# Patient Record
Sex: Male | Born: 1960 | Race: Black or African American | Hispanic: No | Marital: Married | Smoking: Former smoker
Health system: Southern US, Community
[De-identification: ages and names within clinical notes are randomized; demographics above are authoritative.]

## PROBLEM LIST (undated history)

## (undated) DIAGNOSIS — T7840XA Allergy, unspecified, initial encounter: Secondary | ICD-10-CM

## (undated) DIAGNOSIS — M1990 Unspecified osteoarthritis, unspecified site: Secondary | ICD-10-CM

## (undated) DIAGNOSIS — S68019A Complete traumatic metacarpophalangeal amputation of unspecified thumb, initial encounter: Secondary | ICD-10-CM

## (undated) DIAGNOSIS — L209 Atopic dermatitis, unspecified: Secondary | ICD-10-CM

## (undated) DIAGNOSIS — H0260 Xanthelasma of unspecified eye, unspecified eyelid: Secondary | ICD-10-CM

## (undated) DIAGNOSIS — K5790 Diverticulosis of intestine, part unspecified, without perforation or abscess without bleeding: Secondary | ICD-10-CM

## (undated) DIAGNOSIS — M109 Gout, unspecified: Secondary | ICD-10-CM

## (undated) DIAGNOSIS — E785 Hyperlipidemia, unspecified: Secondary | ICD-10-CM

## (undated) DIAGNOSIS — I10 Essential (primary) hypertension: Secondary | ICD-10-CM

## (undated) HISTORY — DX: Xanthelasma of unspecified eye, unspecified eyelid: H02.60

## (undated) HISTORY — PX: MOUTH SURGERY: SHX715

## (undated) HISTORY — PX: FOREARM SURGERY: SHX651

## (undated) HISTORY — DX: Gout, unspecified: M10.9

## (undated) HISTORY — PX: SKIN GRAFT: SHX250

## (undated) HISTORY — DX: Hyperlipidemia, unspecified: E78.5

## (undated) HISTORY — DX: Unspecified osteoarthritis, unspecified site: M19.90

## (undated) HISTORY — DX: Atopic dermatitis, unspecified: L20.9

## (undated) HISTORY — DX: Complete traumatic metacarpophalangeal amputation of unspecified thumb, initial encounter: S68.019A

## (undated) HISTORY — PX: THUMB AMPUTATION: SHX804

## (undated) HISTORY — DX: Essential (primary) hypertension: I10

## (undated) HISTORY — DX: Allergy, unspecified, initial encounter: T78.40XA

## (undated) HISTORY — DX: Diverticulosis of intestine, part unspecified, without perforation or abscess without bleeding: K57.90

---

## 1998-10-09 ENCOUNTER — Ambulatory Visit (HOSPITAL_COMMUNITY): Admission: RE | Admit: 1998-10-09 | Discharge: 1998-10-09 | Payer: Self-pay | Admitting: Orthopedic Surgery

## 1998-10-09 ENCOUNTER — Encounter: Payer: Self-pay | Admitting: Orthopedic Surgery

## 1999-06-14 HISTORY — PX: KNEE SURGERY: SHX244

## 2000-07-12 ENCOUNTER — Encounter: Payer: Self-pay | Admitting: Family Medicine

## 2000-07-12 ENCOUNTER — Encounter: Admission: RE | Admit: 2000-07-12 | Discharge: 2000-07-12 | Payer: Self-pay | Admitting: Family Medicine

## 2002-03-15 ENCOUNTER — Encounter: Admission: RE | Admit: 2002-03-15 | Discharge: 2002-03-15 | Payer: Self-pay | Admitting: Family Medicine

## 2002-03-15 ENCOUNTER — Encounter: Payer: Self-pay | Admitting: Family Medicine

## 2011-12-13 ENCOUNTER — Telehealth: Payer: Self-pay | Admitting: Family Medicine

## 2011-12-13 ENCOUNTER — Encounter: Payer: Self-pay | Admitting: Internal Medicine

## 2011-12-13 ENCOUNTER — Ambulatory Visit (INDEPENDENT_AMBULATORY_CARE_PROVIDER_SITE_OTHER): Payer: BC Managed Care – PPO | Admitting: Medical

## 2011-12-13 ENCOUNTER — Encounter: Payer: Self-pay | Admitting: Medical

## 2011-12-13 VITALS — BP 142/90 | HR 60 | Temp 97.9°F | Resp 16 | Ht 69.0 in | Wt 231.0 lb

## 2011-12-13 DIAGNOSIS — Z1211 Encounter for screening for malignant neoplasm of colon: Secondary | ICD-10-CM

## 2011-12-13 DIAGNOSIS — D492 Neoplasm of unspecified behavior of bone, soft tissue, and skin: Secondary | ICD-10-CM

## 2011-12-13 DIAGNOSIS — Z125 Encounter for screening for malignant neoplasm of prostate: Secondary | ICD-10-CM

## 2011-12-13 DIAGNOSIS — I10 Essential (primary) hypertension: Secondary | ICD-10-CM

## 2011-12-13 DIAGNOSIS — M2550 Pain in unspecified joint: Secondary | ICD-10-CM | POA: Insufficient documentation

## 2011-12-13 DIAGNOSIS — Z0000 Encounter for general adult medical examination without abnormal findings: Secondary | ICD-10-CM

## 2011-12-13 DIAGNOSIS — E785 Hyperlipidemia, unspecified: Secondary | ICD-10-CM

## 2011-12-13 DIAGNOSIS — Z23 Encounter for immunization: Secondary | ICD-10-CM

## 2011-12-13 DIAGNOSIS — E669 Obesity, unspecified: Secondary | ICD-10-CM | POA: Insufficient documentation

## 2011-12-13 LAB — TSH: TSH: 1.103 u[IU]/mL (ref 0.350–4.500)

## 2011-12-13 LAB — POCT URINALYSIS DIPSTICK
Bilirubin, UA: NEGATIVE
Blood, UA: NEGATIVE
Glucose, UA: NEGATIVE
Ketones, UA: NEGATIVE
Leukocytes, UA: NEGATIVE
Nitrite, UA: NEGATIVE
Protein, UA: NEGATIVE
Spec Grav, UA: <=1.005
Urobilinogen, UA: NEGATIVE
pH, UA: 5

## 2011-12-13 LAB — COMPREHENSIVE METABOLIC PANEL
ALT: 30 U/L (ref 0–53)
AST: 19 U/L (ref 0–37)
Albumin: 4.8 g/dL (ref 3.5–5.2)
Alkaline Phosphatase: 42 U/L (ref 39–117)
BUN: 21 mg/dL (ref 6–23)
CO2: 25 mEq/L (ref 19–32)
Calcium: 10 mg/dL (ref 8.4–10.5)
Chloride: 106 mEq/L (ref 96–112)
Creat: 1.21 mg/dL (ref 0.50–1.35)
Glucose, Bld: 98 mg/dL (ref 70–99)
Potassium: 4.4 mEq/L (ref 3.5–5.3)
Sodium: 141 mEq/L (ref 135–145)
Total Bilirubin: 0.5 mg/dL (ref 0.3–1.2)
Total Protein: 7.1 g/dL (ref 6.0–8.3)

## 2011-12-13 LAB — LIPID PANEL
Cholesterol: 276 mg/dL — ABNORMAL HIGH (ref 0–200)
HDL: 36 mg/dL — ABNORMAL LOW (ref >39)
LDL Cholesterol: 205 mg/dL — ABNORMAL HIGH (ref 0–99)
Total CHOL/HDL Ratio: 7.7 Ratio
Triglycerides: 176 mg/dL — ABNORMAL HIGH (ref <150)
VLDL: 35 mg/dL (ref 0–40)

## 2011-12-13 MED ORDER — LISINOPRIL-HYDROCHLOROTHIAZIDE 20-25 MG PO TABS: 1.0000 | ORAL_TABLET | Freq: Every day | ORAL | Status: DC

## 2011-12-13 NOTE — Progress Notes (Signed)
Subjective:   HPI  Troy Stevens is a 51 y.o. male who presents for a complete physical.  New patient today, accompanied by wife.  Has hx/o hypertension, compliant with medication but says BP always elevated at dentist and other places.  He notes hx/o high cholesterol but not prior on medication.  He has never had colonoscopy.  He hasn't been to dentist in a while.  Was seeing [facility] prior, but always had to wait long there so here to establish.  Gets occasional flare up of skin rash on elbow.  He is mainly concerned about arthritis.  Been told in the past he may have RA.  Gets pains and swelling in wrists, both knees and both elbows.  Gets pain in both ankles too.  Sometimes gets hand pains, but mostly wrists pain and swelling in terms of upper extremities.  Was exercising but not much at current due to knee pain.  takes colchicine TID, but has been on Allopurinol prior.   He is not sure whether he has gout or not.  Has lesion of left ear growing last few months.  Reviewed their medical, surgical, family, social, medication, and allergy history and updated chart as appropriate.  Past Medical History  Diagnosis Date  . Hyperlipidemia   . Hypertension   . Allergy   . Arthritis   . Atopic dermatitis   . Amputation of thumb     left, s/p conveyer belt injury  . Xanthelasma of eyelid     Past Surgical History  Procedure Date  . Knee surgery 2001    left, arthroscopic  . Forearm surgery     left trauma repair and skin graft s/p conveyer belt injury  . Skin graft     removed from left thigh, transplanted to left forearm  . Mouth surgery     Family History  Problem Relation Age of Onset  . Other Mother     died in MVA  . Other Father     died in MVA  . Arthritis Brother   . Hypertension Sister   . Hyperlipidemia Paternal Aunt   . Stroke Paternal Grandfather   . Heart disease Neg Hx   . Cancer Neg Hx     History   Social History  . Marital Status: Single   Spouse Name: N/A    Number of Children: N/A  . Years of Education: N/A   Occupational History  . laid off     use to work at [employer]   Social History Main Topics  . Smoking status: Former Smoker -- 0.2 packs/day for 20 years    Types: Cigarettes  . Smokeless tobacco: Not on file  . Alcohol Use: Not on file  . Drug Use: Yes    Special: Marijuana  . Sexually Active: Not on file   Other Topics Concern  . Not on file   Social History Narrative   Married, 2 children, exercise - walking some    Current Outpatient Prescriptions on File Prior to Visit  Medication Sig Dispense Refill  . colchicine 0.6 MG tablet Take 0.6 mg by mouth daily.      Marland Kitchen lisinopril-hydrochlorothiazide (PRINZIDE,ZESTORETIC) 20-25 MG per tablet Take 1 tablet by mouth daily.  30 tablet  5    No Known Allergies    Review of Systems Constitutional: -fever, -chills, -sweats, -unexpected weight change, -anorexia, -fatigue Allergy: -sneezing, -itching, -congestion Dermatology: denies changing moles, rash, lumps, new worrisome lesions ENT: -runny nose, -ear pain, -sore  throat, -hoarseness, -sinus pain, -teeth pain, -tinnitus, -hearing loss, -epistaxis Cardiology:  -chest pain, -palpitations, +edema, -orthopnea, -paroxysmal nocturnal dyspnea Respiratory: -cough, -shortness of breath, -dyspnea on exertion, -wheezing, -hemoptysis Gastroenterology: -abdominal pain, -nausea, -vomiting, -diarrhea, -constipation, -blood in stool, -changes in bowel movement, -dysphagia Hematology: -bleeding or bruising problems Musculoskeletal: +arthralgias, +myalgias,+-joint swelling, +back pain, -neck pain, +cramping, -gait changes Ophthalmology: -vision changes, -eye redness, -itching, -discharge Urology: -dysuria, -difficulty urinating, -hematuria, -urinary frequency, -urgency, incontinence Neurology: -headache, -weakness, -tingling, -numbness, -speech abnormality, -memory loss, -falls, -dizziness Psychology:  -depressed mood,  -agitation, +sleep problems  Objective:   Physical Exam  Filed Vitals:   12/13/11 0846  BP: 142/90  Pulse: 60  Temp: 97.9 F (36.6 C)  Resp: 16    General appearance: alert, no distress, WD/WN, AA male, obese Skin:left upper earlobe with 3mm round hard yellowish lesion, few other benign appearing macules, left lateral forearm with obvious deformity of skin from prior trauma, has appearance of prior burn or skin grafting, left upper thigh with rectangular skin scar from prior skin graft  HEENT: normocephalic, conjunctiva/corneas normal, sclerae anicteric, PERRLA, EOMi, nares patent, no discharge or erythema, pharynx normal Oral cavity: MMM, tongue normal, teeth with left lower molar with decay, otherwise teeth in good repair Neck: supple, no lymphadenopathy, no thyromegaly, no masses, normal ROM, no bruits Chest: non tender, normal shape and expansion Heart: RRR, normal S1, S2, no murmurs Lungs: CTA bilaterally, no wheezes, rhonchi, or rales Abdomen: +bs, soft, non tender, non distended, no masses, no hepatomegaly, no splenomegaly, no bruits Back: non tender, normal ROM, no scoliosis Musculoskeletal: left hand s/p thumb amputation, increased abduction of left index finger, otherwise no obvious swelling, upper extremities non tender, no obvious deformity, normal ROM throughout, lower extremities non tender, no obvious deformity, normal ROM throughout Extremities: no edema, no cyanosis, no clubbing Pulses: 2+ symmetric, upper and lower extremities, normal cap refill Neurological: alert, oriented x 3, CN2-12 intact, strength normal upper extremities and lower extremities, sensation normal throughout, DTRs 2+ throughout, no cerebellar signs, gait normal Psychiatric: normal affect, behavior normal, pleasant  GU: normal male external genitalia, nontender, no masses, no hernia, no lymphadenopathy Rectal: anus normal, prostate WNL, no nodules, occult negative stool   Assessment and Plan :      Encounter Diagnoses  Name Primary?  . Routine general medical examination at a health care facility Yes  . Essential hypertension, benign   . Hyperlipidemia   . Obesity   . Polyarthralgia   . Screen for colon cancer   . Screening for prostate cancer   . Need for Tdap vaccination   . Neoplasm of skin of ear     Physical exam - discussed healthy lifestyle, diet, exercise, preventative care, vaccinations, and addressed their concerns.    HTN - increased to Lisinopril/HCT 20/25 mg today.  Recheck 67mo.  Hyperlipidemia and xanthelasma  - will likely need to address medication.  Labs today.  Obesity - discussed need for lifestyle changes, weight loss  Polyarthralgia - given prior medication and hx/o, may have gout and OA, but labs today to screen for rheumatoid arthritis  Referrals for colonoscopy screening and dermatology today for left ear skin lesion.     tdap given, VIS and counseling given  Follow-up pending labs and referrals, recheck 67mo on HTN.

## 2011-12-13 NOTE — Telephone Encounter (Signed)
Patient was made aware of his 2 appointments at the time of his OV 12/13/11 @ 1000 am. [facility] Aug. [phone] @ 920 am [facility] Aug 6,0454 @ 800 am Nurse Visit Aug 20,2013 @ 1100 am [facility]

## 2011-12-13 NOTE — Patient Instructions (Addendum)
Recommendations:  We are referring you to gastroenterology for first screening colonoscopy  We are referring you to dermatology for skin lesion of left earlobe  i increased your blood pressure medication to Lisinopril/HCT 20/25 today.  Lets recheck this in 40month  We updated your tetanus booster today  We are checking labs today for possible gout vs rheumatoid arthritis  We will call with lab results    Preventative Care for Adults, Male       REGULAR HEALTH EXAMS:  A routine yearly physical is a good way to check in with your primary care provider about your health and preventive screening. It is also an opportunity to share updates about your health and any concerns you have, and receive a thorough all-over exam.   Most health insurance companies pay for at least some preventative services.  Check with your health plan for specific coverages.  WHAT PREVENTATIVE SERVICES DO MEN NEED?  Adult men should have their weight and blood pressure checked regularly.   Men age 7 and older should have their cholesterol levels checked regularly.  Beginning at age 21 and continuing to age 66, men should be screened for colorectal cancer.  Certain people should may need continued testing until age 69.  Other cancer screening may include exams for testicular and prostate cancer.  Updating vaccinations is part of preventative care.  Vaccinations help protect against diseases such as the flu.  Lab tests are generally done as part of preventative care to screen for anemia and blood disorders, to screen for problems with the kidneys and liver, to screen for bladder problems, to check blood sugar, and to check your cholesterol level.  Preventative services generally include counseling about diet, exercise, avoiding tobacco, drugs, excessive alcohol consumption, and sexually transmitted infections.    GENERAL RECOMMENDATIONS FOR GOOD HEALTH:  Healthy diet:  Eat a variety of foods, including  fruit, vegetables, animal or vegetable protein, such as meat, fish, chicken, and eggs, or beans, lentils, tofu, and grains, such as rice.  Drink plenty of water daily.  Decrease saturated fat in the diet, avoid lots of red meat, processed foods, sweets, fast foods, and fried foods.  Exercise:  Aerobic exercise helps maintain good heart health. At least 30-40 minutes of moderate-intensity exercise is recommended. For example, a brisk walk that increases your heart rate and breathing. This should be done on most days of the week.   Find a type of exercise or a variety of exercises that you enjoy so that it becomes a part of your daily life.  Examples are running, walking, swimming, water aerobics, and biking.  For motivation and support, explore group exercise such as aerobic class, spin class, Zumba, Yoga,or  martial arts, etc.    Set exercise goals for yourself, such as a certain weight goal, walk or run in a race such as a 5k walk/run.  Speak to your primary care provider about exercise goals.  Disease prevention:  If you smoke or chew tobacco, find out from your caregiver how to quit. It can literally save your life, no matter how long you have been a tobacco user. If you do not use tobacco, never begin.   Maintain a healthy diet and normal weight. Increased weight leads to problems with blood pressure and diabetes.   The Body Mass Index or BMI is a way of measuring how much of your body is fat. Having a BMI above 27 increases the risk of heart disease, diabetes, hypertension, stroke and other problems  related to obesity. Your caregiver can help determine your BMI and based on it develop an exercise and dietary program to help you achieve or maintain this important measurement at a healthful level.  High blood pressure causes heart and blood vessel problems.  Persistent high blood pressure should be treated with medicine if weight loss and exercise do not work.   Fat and cholesterol leaves  deposits in your arteries that can block them. This causes heart disease and vessel disease elsewhere in your body.  If your cholesterol is found to be high, or if you have heart disease or certain other medical conditions, then you may need to have your cholesterol monitored frequently and be treated with medication.   Ask if you should have a stress test if your history suggests this. A stress test is a test done on a treadmill that looks for heart disease. This test can find disease prior to there being a problem.  Avoid drinking alcohol in excess (more than two drinks per day).  Avoid use of street drugs. Do not share needles with anyone. Ask for professional help if you need assistance or instructions on stopping the use of alcohol, cigarettes, and/or drugs.  Brush your teeth twice a day with fluoride toothpaste, and floss once a day. Good oral hygiene prevents tooth decay and gum disease. The problems can be painful, unattractive, and can cause other health problems. Visit your dentist for a routine oral and dental check up and preventive care every 6-12 months.   Look at your skin regularly.  Use a mirror to look at your back. Notify your caregivers of changes in moles, especially if there are changes in shapes, colors, a size larger than a pencil eraser, an irregular border, or development of new moles.  Safety:  Use seatbelts 100% of the time, whether driving or as a passenger.  Use safety devices such as hearing protection if you work in environments with loud noise or significant background noise.  Use safety glasses when doing any work that could send debris in to the eyes.  Use a helmet if you ride a bike or motorcycle.  Use appropriate safety gear for contact sports.  Talk to your caregiver about gun safety.  Use sunscreen with a SPF (or skin protection factor) of 15 or greater.  Lighter skinned people are at a greater risk of skin cancer. Don't forget to also wear sunglasses in order to  protect your eyes from too much damaging sunlight. Damaging sunlight can accelerate cataract formation.   Practice safe sex. Use condoms. Condoms are used for birth control and to help reduce the spread of sexually transmitted infections (or STIs).  Some of the STIs are gonorrhea (the clap), chlamydia, syphilis, trichomonas, herpes, HPV (human papilloma virus) and HIV (human immunodeficiency virus) which causes AIDS. The herpes, HIV and HPV are viral illnesses that have no cure. These can result in disability, cancer and death.   Keep carbon monoxide and smoke detectors in your home functioning at all times. Change the batteries every 6 months or use a model that plugs into the wall.   Vaccinations:  Stay up to date with your tetanus shots and other required immunizations. You should have a booster for tetanus every 10 years. Be sure to get your flu shot every year, since 5%-20% of the [place] population comes down with the flu. The flu vaccine changes each year, so being vaccinated once is not enough. Get your shot in the fall, before the  flu season peaks.   Other vaccines to consider:  Pneumococcal vaccine to protect against certain types of pneumonia.  This is normally recommended for adults age 66 or older.  However, adults younger than 51 years old with certain underlying conditions such as diabetes, heart or lung disease should also receive the vaccine.  Shingles vaccine to protect against Varicella Zoster if you are older than age 92, or younger than 51 years old with certain underlying illness.  Hepatitis A vaccine to protect against a form of infection of the liver by a virus acquired from food.  Hepatitis B vaccine to protect against a form of infection of the liver by a virus acquired from blood or body fluids, particularly if you work in health care.  If you plan to travel internationally, check with your local health department for specific vaccination recommendations.  Cancer  Screening:  Most routine colon cancer screening begins at the age of 41. On a yearly basis, doctors may provide special easy to use take-home tests to check for hidden blood in the stool. Sigmoidoscopy or colonoscopy can detect the earliest forms of colon cancer and is life saving. These tests use a small camera at the end of a tube to directly examine the colon. Speak to your caregiver about this at age 12, when routine screening begins (and is repeated every 5 years unless early forms of pre-cancerous polyps or small growths are found).   At the age of 80 men usually start screening for prostate cancer every year. Screening may begin at a younger age for those with higher risk. Those at higher risk include African-Americans or having a family history of prostate cancer. There are two types of tests for prostate cancer:   Prostate-specific antigen (PSA) testing. Recent studies raise questions about prostate cancer using PSA and you should discuss this with your caregiver.   Digital rectal exam (in which your doctor's lubricated and gloved finger feels for enlargement of the prostate through the anus).   Screening for testicular cancer.  Do a monthly exam of your testicles. Gently roll each testicle between your thumb and fingers, feeling for any abnormal lumps. The best time to do this is after a hot shower or bath when the tissues are looser. Notify your caregivers of any lumps, tenderness or changes in size or shape immediately.

## 2011-12-14 LAB — CBC
HCT: 43.5 % (ref 39.0–52.0)
Hemoglobin: 14.4 g/dL (ref 13.0–17.0)
MCH: 31 pg (ref 26.0–34.0)
MCHC: 33.1 g/dL (ref 30.0–36.0)
MCV: 93.5 fL (ref 78.0–100.0)
Platelets: 300 10*3/uL (ref 150–400)
RBC: 4.65 MIL/uL (ref 4.22–5.81)
RDW: 14.1 % (ref 11.5–15.5)
WBC: 6.3 10*3/uL (ref 4.0–10.5)

## 2011-12-14 LAB — SEDIMENTATION RATE: Sed Rate: 4 mm/hr (ref 0–16)

## 2012-01-04 ENCOUNTER — Ambulatory Visit (INDEPENDENT_AMBULATORY_CARE_PROVIDER_SITE_OTHER): Payer: BC Managed Care – PPO | Admitting: Medical

## 2012-01-04 ENCOUNTER — Encounter: Payer: Self-pay | Admitting: Medical

## 2012-01-04 VITALS — BP 130/80 | HR 64 | Temp 98.1°F | Resp 16 | Wt 224.0 lb

## 2012-01-04 DIAGNOSIS — T50905A Adverse effect of unspecified drugs, medicaments and biological substances, initial encounter: Secondary | ICD-10-CM

## 2012-01-04 DIAGNOSIS — M1A9XX1 Chronic gout, unspecified, with tophus (tophi): Secondary | ICD-10-CM | POA: Insufficient documentation

## 2012-01-04 DIAGNOSIS — N529 Male erectile dysfunction, unspecified: Secondary | ICD-10-CM

## 2012-01-04 DIAGNOSIS — I10 Essential (primary) hypertension: Secondary | ICD-10-CM

## 2012-01-04 DIAGNOSIS — E785 Hyperlipidemia, unspecified: Secondary | ICD-10-CM

## 2012-01-04 DIAGNOSIS — M109 Gout, unspecified: Secondary | ICD-10-CM

## 2012-01-04 MED ORDER — LISINOPRIL 20 MG PO TABS: 20.0000 mg | ORAL_TABLET | Freq: Every day | ORAL | Status: DC

## 2012-01-04 MED ORDER — SILDENAFIL CITRATE 50 MG PO TABS: 50.0000 mg | ORAL_TABLET | Freq: Every day | ORAL | Status: DC | PRN

## 2012-01-04 MED ORDER — ASPIRIN 81 MG PO TBEC: 81.0000 mg | DELAYED_RELEASE_TABLET | Freq: Every day | ORAL | Status: DC

## 2012-01-04 MED ORDER — ALLOPURINOL 100 MG PO TABS: 100.0000 mg | ORAL_TABLET | Freq: Every day | ORAL | Status: DC

## 2012-01-04 MED ORDER — ATORVASTATIN CALCIUM 20 MG PO TABS: 20.0000 mg | ORAL_TABLET | Freq: Every day | ORAL | Status: DC

## 2012-01-04 NOTE — Progress Notes (Signed)
Subjective:   HPI  Troy Stevens is a 51 y.o. male who presents with 61mo f/u.  I saw him last month for a new patient physical.  Here for recheck on blood pressure.   i increased his medication from Lisinopril 20mg  to Lisinopril/HCT 20/25mg  last visit, but the first 3 days starting this he got bad cramps.  He was also out in the host sun those days.  He quit this due to the bad cramps.  Things went back to normal after starting the medication.  He is here for f/u on abnormal labs regarding uric acid/gout, cholesterol, and in general.  He note ED for years.  Has had problems getting and keeping erections.  Currently has no morning erections and rarely gets a hard enough erection for penetration.  Has used Viagra in the remote past .  Would like something to help erections.  No other aggravating or relieving factors.    No other c/o.  The following portions of the patient's history were reviewed and updated as appropriate: allergies, current medications, past family history, past medical history, past social history, past surgical history and problem list.  Past Medical History  Diagnosis Date  . Hyperlipidemia   . Hypertension   . Allergy   . Arthritis   . Atopic dermatitis   . Amputation of thumb     left, s/p conveyer belt injury  . Xanthelasma of eyelid   . Gout     No Known Allergies   Review of Systems ROS reviewed and was negative other than noted in HPI or above.    Objective:   Physical Exam  General appearance: alert, no distress, WD/WN Heart: RRR, normal S1, S2, no murmurs Lungs: CTA bilaterally, no wheezes, rhonchi, or rales  Assessment and Plan :     Encounter Diagnoses  Name Primary?  . Essential hypertension, benign Yes  . Hyperlipidemia   . Gout   . ED (erectile dysfunction)   . Adverse drug effect    HTN - improved today.  Just c/t back on Lisinopril 20mg  daily and recheck 4mo.  Work on diet, exercise, and weight loss as well.    Hyperlipidemia -  discussed risks of elevated lipids, begin Lipitor 20mg  daily and Aspirin 81mg  daily.    Gout - uric acid 11.2.  Begin Allopurinol 100mg  daily.   He has been on 300mg  daily in the past.  Use colchicine BID for the next 1-2 wk while starting Allopurinol.   ED - discussed causes, begin Viagra trial, 50mg , 1/2 tablet per day intially.  discussed risks of medication, when to call/return if problems. He has tried Viagra in the past (100mg ) with good response.   Adverse drug effect - didn't tolerate 25mg  HCTZ due to bad cramps.   RTC 4mo, sooner prn.

## 2012-01-04 NOTE — Patient Instructions (Signed)
Blood pressure - lets go back to Lisinopril 20mg  daily.  We will hold off on the fluid pill for now.  Gout - begin Allopurinol 100mg  daily to lower your uric acid.  During the first 2 weeks while starting on the Allopurinol, I want you to use the Colchicine/Colcyrs twice daily to help reduce the chance of a flare up.  You cholesterol is too high.  I want you to begin Lipitor every night to lower cholesterol.  Also, begin a baby aspirin 81mg  every night with the Lipitor to lower your heart disease risks.   For knee arthritis - lets see how the allopurinol does, but after a few weeks, if you still have joint pains, consider OTC Tylenol Arthritis or Aleve as needed up to twice daily.

## 2012-01-17 ENCOUNTER — Encounter: Payer: Self-pay | Admitting: Internal Medicine

## 2012-01-17 ENCOUNTER — Ambulatory Visit (AMBULATORY_SURGERY_CENTER): Payer: BC Managed Care – PPO | Admitting: *Deleted

## 2012-01-17 VITALS — Ht 68.0 in | Wt 225.4 lb

## 2012-01-17 DIAGNOSIS — Z1211 Encounter for screening for malignant neoplasm of colon: Secondary | ICD-10-CM

## 2012-01-17 MED ORDER — MOVIPREP 100 G PO SOLR: ORAL | Status: DC

## 2012-01-31 ENCOUNTER — Ambulatory Visit (AMBULATORY_SURGERY_CENTER): Payer: BC Managed Care – PPO | Admitting: Internal Medicine

## 2012-01-31 ENCOUNTER — Encounter: Payer: Self-pay | Admitting: Internal Medicine

## 2012-01-31 VITALS — BP 129/79 | HR 70 | Temp 97.7°F | Resp 17 | Ht 68.0 in | Wt 225.0 lb

## 2012-01-31 DIAGNOSIS — Z1211 Encounter for screening for malignant neoplasm of colon: Secondary | ICD-10-CM

## 2012-01-31 HISTORY — PX: COLONOSCOPY: SHX174

## 2012-01-31 MED ORDER — SODIUM CHLORIDE 0.9 % IV SOLN: 500.0000 mL | INTRAVENOUS | Status: DC

## 2012-01-31 NOTE — Patient Instructions (Addendum)
The colon was normal! No polyps or cancer seen.  Next routine colonoscopy in about 10 years (2023).  Thank you for choosing me and [facility]. Diverticulosis, mild. Iva Boop, MD, FACG YOU HAD AN ENDOSCOPIC PROCEDURE TODAY AT THE [facility]: Refer to the procedure report that was given to you for any specific questions about what was found during the examination.  If the procedure report does not answer your questions, please call your gastroenterologist to clarify.  If you requested that your care partner not be given the details of your procedure findings, then the procedure report has been included in a sealed envelope for you to review at your convenience later.  YOU SHOULD EXPECT: Some feelings of bloating in the abdomen. Passage of more gas than usual.  Walking can help get rid of the air that was put into your GI tract during the procedure and reduce the bloating. If you had a lower endoscopy (such as a colonoscopy or flexible sigmoidoscopy) you may notice spotting of blood in your stool or on the toilet paper. If you underwent a bowel prep for your procedure, then you may not have a normal bowel movement for a few days.  DIET: Your first meal following the procedure should be a light meal and then it is ok to progress to your normal diet.  A half-sandwich or bowl of soup is an example of a good first meal.  Heavy or fried foods are harder to digest and may make you feel nauseous or bloated.  Likewise meals heavy in dairy and vegetables can cause extra gas to form and this can also increase the bloating.  Drink plenty of fluids but you should avoid alcoholic beverages for 24 hours.  ACTIVITY: Your care partner should take you home directly after the procedure.  You should plan to take it easy, moving slowly for the rest of the day.  You can resume normal activity the day after the procedure however you should NOT DRIVE or use heavy machinery for 24 hours (because  of the sedation medicines used during the test).    SYMPTOMS TO REPORT IMMEDIATELY: A gastroenterologist can be reached at any hour.  During normal business hours, 8:30 AM to 5:00 PM Monday through Friday, call [phone].  After hours and on weekends, please call the GI answering service at [phone] who will take a message and have the physician on call contact you.   Following lower endoscopy (colonoscopy or flexible sigmoidoscopy):  Excessive amounts of blood in the stool  Significant tenderness or worsening of abdominal pains  Swelling of the abdomen that is new, acute  Fever of 100F or higher  Following upper endoscopy (EGD)  Vomiting of blood or coffee ground material  New chest pain or pain under the shoulder blades  Painful or persistently difficult swallowing  New shortness of breath  Fever of 100F or higher  Black, tarry-looking stools  FOLLOW UP: If any biopsies were taken you will be contacted by phone or by letter within the next 1-3 weeks.  Call your gastroenterologist if you have not heard about the biopsies in 3 weeks.  Our staff will call the home number listed on your records the next business day following your procedure to check on you and address any questions or concerns that you may have at that time regarding the information given to you following your procedure. This is a courtesy call and so if there is no answer at the home  number and we have not heard from you through the emergency physician on call, we will assume that you have returned to your regular daily activities without incident.  SIGNATURES/CONFIDENTIALITY: You and/or your care partner have signed paperwork which will be entered into your electronic medical record.  These signatures attest to the fact that that the information above on your After Visit Summary has been reviewed and is understood.  Full responsibility of the confidentiality of this discharge information lies with you and/or  your care-partner.

## 2012-01-31 NOTE — Progress Notes (Signed)
Patient did not experience any of the following events: a burn prior to discharge; a fall within the facility; wrong site/side/patient/procedure/implant event; or a hospital transfer or hospital admission upon discharge from the facility. (G8907) Patient did not have preoperative order for IV antibiotic SSI prophylaxis. (G8918)  

## 2012-01-31 NOTE — Op Note (Addendum)
[facility] [address] [place], [address]   COLONOSCOPY PROCEDURE REPORT  PATIENT: Troy Stevens, Troy Stevens  MR#: [id number] BIRTHDATE: [birth year] , 51  yrs. old GENDER: Male ENDOSCOPIST: Iva Boop, MD, [facility] REFERRED QM:VHQIONGE, Shane PA-C PROCEDURE DATE:  01/31/2012 PROCEDURE:   Colonoscopy, screening ASA CLASS:   Class III INDICATIONS:average risk screening. MEDICATIONS: propofol (Diprivan) 350mg  IV, MAC sedation, administered by CRNA, and These medications were titrated to patient response per physician's verbal order  DESCRIPTION OF PROCEDURE:   After the risks benefits and alternatives of the procedure were thoroughly explained, informed consent was obtained.  A digital rectal exam revealed the prostate was not enlarged.   The LB [id number]  endoscope was introduced through the anus and advanced to the cecum, which was identified by both the appendix and ileocecal valve. No adverse events experienced.   The quality of the prep was excellent, using MoviPrep  The instrument was then slowly withdrawn as the colon was fully examined.    COLON FINDINGS: A normal appearing cecum, ileocecal valve, and appendiceal orifice were identified.  The ascending, hepatic flexure, transverse, splenic flexure, descending, and rectum appeared unremarkable.  No polyps or cancers were seen.   A right colon retroflexion was performed.   Moderate diverticulosis was noted in the sigmoid colon.    Retroflexed views revealed no abnormalities.  The time to cecum = 1:19 minutes  Withdrawal time= 8:57 minutes.  The scope was withdrawn and the procedure completed. COMPLICATIONS: There were no complications. ENDOSCOPIC IMPRESSION: 1.   Sigmoid diverticulosis 2. Otherwise normal colon with excellent prep   RECOMMENDATIONS: Repeat Colonscopy in 10 years.  eSigned:  Iva Boop, MD, [facility] 01/31/2012 11:54 AM Revised: 01/31/2012 11:54 AM  CC: The Patient

## 2012-02-01 ENCOUNTER — Telehealth: Payer: Self-pay | Admitting: *Deleted

## 2012-02-01 NOTE — Telephone Encounter (Signed)
  Follow up Call-  Call back number 01/31/2012  Post procedure Call Back phone  # [phone]  Permission to leave phone message Yes     Patient questions:  Do you have a fever, pain , or abdominal swelling? no Pain Score  0 *  Have you tolerated food without any problems? yes  Have you been able to return to your normal activities? yes  Do you have any questions about your discharge instructions: Diet   no Medications  no Follow up visit  no  Do you have questions or concerns about your Care? no  Actions: * If pain score is 4 or above: No action needed, pain <4.

## 2012-04-03 ENCOUNTER — Ambulatory Visit (INDEPENDENT_AMBULATORY_CARE_PROVIDER_SITE_OTHER): Payer: BC Managed Care – PPO | Admitting: Medical

## 2012-04-03 ENCOUNTER — Encounter: Payer: Self-pay | Admitting: Medical

## 2012-04-03 VITALS — BP 120/80 | HR 80 | Temp 97.9°F | Resp 16 | Wt 224.0 lb

## 2012-04-03 DIAGNOSIS — N529 Male erectile dysfunction, unspecified: Secondary | ICD-10-CM

## 2012-04-03 DIAGNOSIS — M109 Gout, unspecified: Secondary | ICD-10-CM

## 2012-04-03 DIAGNOSIS — I10 Essential (primary) hypertension: Secondary | ICD-10-CM

## 2012-04-03 DIAGNOSIS — E785 Hyperlipidemia, unspecified: Secondary | ICD-10-CM

## 2012-04-03 LAB — COMPREHENSIVE METABOLIC PANEL
ALT: 38 U/L (ref 0–53)
AST: 26 U/L (ref 0–37)
Albumin: 4.8 g/dL (ref 3.5–5.2)
BUN: 18 mg/dL (ref 6–23)
CO2: 28 mEq/L (ref 19–32)
Calcium: 9.7 mg/dL (ref 8.4–10.5)
Chloride: 103 mEq/L (ref 96–112)
Creat: 1.27 mg/dL (ref 0.50–1.35)
Potassium: 4.2 mEq/L (ref 3.5–5.3)

## 2012-04-03 LAB — LIPID PANEL: Cholesterol: 137 mg/dL (ref 0–200)

## 2012-04-03 MED ORDER — SILDENAFIL CITRATE 100 MG PO TABS: 50.0000 mg | ORAL_TABLET | Freq: Every day | ORAL | Status: DC | PRN

## 2012-04-03 NOTE — Progress Notes (Signed)
Subjective: Here for 26mo f/u.  Last visit we made several changes.   He began back on Lisionpril 20mg  daily, he began Lipitor and aspirin for hyperlipidemia.  Compliant with medications without c/o.    ED - used trial of viagra at 25mg  and 50mg , and the higher dose worked ok, but thinks it needs to be a little stronger. No problems with the medication.  Gout - using Allopurinol 100mg  daily, but also using Colchicine BID although he was advised to use this for prn flare.  No gout flare ups since last visit.  Objective:   Physical Exam  Filed Vitals:   04/03/12 0807  BP: 120/80  Pulse: 80  Temp: 97.9 F (36.6 C)  Resp: 16    General appearance: alert, no distress, WD/WN Heart: RRR, normal S1, S2, no murmurs Lungs: CTA bilaterally, no wheezes, rhonchi, or rales Pulses: 2+ symmetric, upper and lower extremities, normal cap refill   Assessment and Plan :    Encounter Diagnoses  Name Primary?  . Essential hypertension, benign Yes  . Hyperlipidemia   . Gout   . ED (erectile dysfunction)    HTN - controlled on Lisinopril 20mg  daily.  C/t efforts to lose weight.  Labs today.  Hyperlipidemia - c/t Lipitor 20mg  daily, ASA 81mg  daily, labs today.  Lipitor started last visit.  Gout - stop Colchicine and just use for prn flare up.  C/t Allopurinol 100mg  daily.  ED - increase to trial of Viagra 100mg .  Samples given.   He declines flu vaccine.  F/u pending labs.

## 2012-04-04 ENCOUNTER — Telehealth: Payer: Self-pay | Admitting: Internal Medicine

## 2012-04-04 MED ORDER — ATORVASTATIN CALCIUM 20 MG PO TABS: 20.0000 mg | ORAL_TABLET | Freq: Every day | ORAL | Status: DC

## 2012-04-04 NOTE — Telephone Encounter (Signed)
I SENT THE PATIENTS RX REFILLS INTO HIS PHARMACY. CLS

## 2012-04-27 ENCOUNTER — Telehealth: Payer: Self-pay | Admitting: Internal Medicine

## 2012-04-27 MED ORDER — ALLOPURINOL 100 MG PO TABS: 100.0000 mg | ORAL_TABLET | Freq: Every day | ORAL | Status: DC

## 2012-04-27 NOTE — Telephone Encounter (Signed)
RX REFILL WAS SENT TO THE PHARMACY. CLS 

## 2012-05-02 ENCOUNTER — Encounter: Payer: Self-pay | Admitting: Medical

## 2012-05-02 ENCOUNTER — Ambulatory Visit (INDEPENDENT_AMBULATORY_CARE_PROVIDER_SITE_OTHER): Payer: BC Managed Care – PPO | Admitting: Medical

## 2012-05-02 VITALS — BP 138/90 | HR 68 | Temp 97.7°F | Resp 16 | Wt 221.0 lb

## 2012-05-02 DIAGNOSIS — M25519 Pain in unspecified shoulder: Secondary | ICD-10-CM

## 2012-05-02 DIAGNOSIS — M25511 Pain in right shoulder: Secondary | ICD-10-CM

## 2012-05-02 DIAGNOSIS — M109 Gout, unspecified: Secondary | ICD-10-CM

## 2012-05-02 MED ORDER — INDOMETHACIN 50 MG PO CAPS: 50.0000 mg | ORAL_CAPSULE | Freq: Three times a day (TID) | ORAL | Status: DC

## 2012-05-02 MED ORDER — ALLOPURINOL 300 MG PO TABS: 300.0000 mg | ORAL_TABLET | Freq: Every day | ORAL | Status: DC

## 2012-05-02 NOTE — Progress Notes (Signed)
Subjective: Here for c/o right shoulder girdle pain this past week, hurts when move a certain way, neck was stiff too, but neck got better.  Still has a little right shoulder discomfort .   Right foot started hurting along ankle over the weekend.  Started in forefoot, then moved the ankle.  Monday morning could hardly walk.  No redness or swelling.  Denies injury.  He does have hx/o gout though.    Objective: Gen: wd, wn , nad Skin: slight warmth of right ankle MSK: right shoulder nontender, mild pain with ROM, ROM full, negative special tests, right ankle tender medial and lateral including medial malleoli, ROM decreased significantly,slightly swollen generalized around ankle, otherwise nontender, rest of MSK unremarkable Neurovascularly intact  Assessment: Encounter Diagnoses  Name Primary?  . Gout Yes  . Shoulder pain, right    Plan: Increase allopurinol to 300mg  daily, script for indocin for acute flare, may use his colchicine for acute flare.  Uric acid lab today.  Call/return if not improving in 3-4 days.

## 2012-05-02 NOTE — Patient Instructions (Signed)
I am going to treat this as a gout flare.    Begin Indocin 1 tablet 3 times daily for 2-5 days for flare up.  You can also use your colchicine twice daily as needed for the next several days until this has resolved.    I am increasing your Allopurinol to 300mg  daily.    Call in 3 days to let me know if this is improving.

## 2012-05-03 ENCOUNTER — Encounter: Payer: Self-pay | Admitting: Medical

## 2012-05-03 LAB — URIC ACID: Uric Acid, Serum: 7.5 mg/dL (ref 4.0–7.8)

## 2012-05-09 ENCOUNTER — Telehealth: Payer: Self-pay | Admitting: Internal Medicine

## 2012-05-09 MED ORDER — ATORVASTATIN CALCIUM 20 MG PO TABS: 20.0000 mg | ORAL_TABLET | Freq: Every day | ORAL | Status: DC

## 2012-05-09 NOTE — Telephone Encounter (Signed)
Got a refill request for atorvastatin 20mg . It was done 10/23 but not sure it went through

## 2012-05-16 ENCOUNTER — Telehealth: Payer: Self-pay | Admitting: Internal Medicine

## 2012-05-16 MED ORDER — LISINOPRIL 20 MG PO TABS: 20.0000 mg | ORAL_TABLET | Freq: Every day | ORAL | Status: DC

## 2012-05-16 NOTE — Telephone Encounter (Signed)
Sent in lisinopril ?

## 2012-05-21 ENCOUNTER — Telehealth: Payer: Self-pay | Admitting: Medical

## 2012-05-22 ENCOUNTER — Other Ambulatory Visit: Payer: Self-pay | Admitting: Medical

## 2012-05-22 MED ORDER — INDOMETHACIN 50 MG PO CAPS: 50.0000 mg | ORAL_CAPSULE | Freq: Three times a day (TID) | ORAL | Status: DC

## 2012-05-22 NOTE — Telephone Encounter (Signed)
PATIENT IS AWARE OF THE RX THAT WAS SENT TO THE PHARMACY AND TO FOLLOW UP . CLS

## 2012-05-22 NOTE — Telephone Encounter (Signed)
i sent refill on indocin.  Lets see him back in 2-3 wk.  If still having gout problems, we may need to recheck lab, consider other preventative measures

## 2012-06-04 ENCOUNTER — Telehealth: Payer: Self-pay | Admitting: Internal Medicine

## 2012-06-04 MED ORDER — INDOMETHACIN 50 MG PO CAPS: 50.0000 mg | ORAL_CAPSULE | Freq: Three times a day (TID) | ORAL | Status: DC

## 2012-06-04 NOTE — Telephone Encounter (Signed)
Indocin renewed 

## 2012-06-11 ENCOUNTER — Other Ambulatory Visit: Payer: Self-pay | Admitting: Family Medicine

## 2012-06-11 ENCOUNTER — Other Ambulatory Visit: Payer: Self-pay | Admitting: Medical

## 2012-06-11 MED ORDER — LISINOPRIL 20 MG PO TABS: 20.0000 mg | ORAL_TABLET | Freq: Every day | ORAL | Status: DC

## 2012-06-11 NOTE — Telephone Encounter (Signed)
RX refill for medication was sent to the pharmacy. CLS

## 2012-07-06 ENCOUNTER — Encounter: Payer: Self-pay | Admitting: Medical

## 2012-07-06 ENCOUNTER — Ambulatory Visit
Admission: RE | Admit: 2012-07-06 | Discharge: 2012-07-06 | Disposition: A | Discharge status: Home / Self Care | Payer: BC Managed Care – PPO | Source: Ambulatory Visit | Attending: Medical | Admitting: Medical

## 2012-07-06 ENCOUNTER — Ambulatory Visit (INDEPENDENT_AMBULATORY_CARE_PROVIDER_SITE_OTHER): Payer: BC Managed Care – PPO | Admitting: Medical

## 2012-07-06 VITALS — BP 140/92 | HR 62 | Temp 98.0°F | Resp 16 | Wt 227.0 lb

## 2012-07-06 DIAGNOSIS — G8929 Other chronic pain: Secondary | ICD-10-CM

## 2012-07-06 DIAGNOSIS — M1A9XX1 Chronic gout, unspecified, with tophus (tophi): Secondary | ICD-10-CM

## 2012-07-06 DIAGNOSIS — M25562 Pain in left knee: Secondary | ICD-10-CM

## 2012-07-06 DIAGNOSIS — M109 Gout, unspecified: Secondary | ICD-10-CM

## 2012-07-06 DIAGNOSIS — M1A00X1 Idiopathic chronic gout, unspecified site, with tophus (tophi): Secondary | ICD-10-CM

## 2012-07-06 DIAGNOSIS — M436 Torticollis: Secondary | ICD-10-CM

## 2012-07-06 DIAGNOSIS — M25569 Pain in unspecified knee: Secondary | ICD-10-CM

## 2012-07-06 DIAGNOSIS — M25529 Pain in unspecified elbow: Secondary | ICD-10-CM

## 2012-07-06 MED ORDER — ALLOPURINOL 100 MG PO TABS: 100.0000 mg | ORAL_TABLET | Freq: Every day | ORAL | Status: DC

## 2012-07-06 MED ORDER — COLCHICINE 0.6 MG PO TABS: 0.6000 mg | ORAL_TABLET | Freq: Every day | ORAL | Status: DC

## 2012-07-06 MED ORDER — INDOMETHACIN 50 MG PO CAPS: 50.0000 mg | ORAL_CAPSULE | Freq: Three times a day (TID) | ORAL | Status: DC

## 2012-07-06 MED ORDER — OXYCODONE HCL 5 MG PO TABS: 5.0000 mg | ORAL_TABLET | Freq: Four times a day (QID) | ORAL | Status: DC | PRN

## 2012-07-06 MED ORDER — CYCLOBENZAPRINE HCL 10 MG PO TABS: 10.0000 mg | ORAL_TABLET | Freq: Three times a day (TID) | ORAL | Status: DC | PRN

## 2012-07-06 NOTE — Progress Notes (Signed)
Subjective: Here for c/o neck stiffness.  Was watching football last Sunday and neck got stiff.  Over the course of this week, can't move neck very much, still sore and stiff, decreased ROM.  Heat helped some, but just can't get relief.  Took 10 OTC Ibuprofen last night just to get some relief.   Used Indocin yesterday with no improvement.     After last visit started also having ankle pain in left ankle too.  Right ankle finally got a little better, but then left ankle started hurting.  Within a few days both ankles quit hurting.  Using Allopurinol 300mg  daily.  No current ankle pain or swelling or redness.  He does note chronic bony changes to both elbows, left knee, and right medial ankle.  Feels bony growths in these joints.  He reports right wrist swells at times and has pain in right wrist and fingers in general at times.   Hands can be stiff in mornings, but denies generalized stiffness in mornings.    No other joint swelling or warmth.  Past Medical History  Diagnosis Date  . Hyperlipidemia   . Hypertension   . Allergy   . Arthritis   . Atopic dermatitis   . Amputation of thumb     left, s/p conveyer belt injury  . 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 Gout   . Substance abuse     marijuania weekly   Review of Systems Constitutional: -fever, -chills, -sweats, -unexpected -weight change,-fatigue ENT: -runny nose, -ear pain, -sore throat Cardiology:  -chest pain, -palpitations, -edema Respiratory: -cough, -shortness of breath, -wheezing Gastroenterology: -abdominal pain, -nausea, -vomiting, -diarrhea, -constipation Hematology: -bleeding or bruising problems Musculoskeletal: -arthralgias, -myalgias, -joint swelling, -back pain Ophthalmology: -vision changes Urology: -dysuria, -difficulty urinating, -hematuria, -urinary frequency, -urgency Neurology: -headache, -weakness, -tingling, -numbness    Objective: Filed Vitals:   07/06/12 0915  BP: 140/92  Pulse: 62  Temp: 98 F (36.7  C)  Resp: 16    General appearance: alert, no distress, WD/WN Oral cavity: MMM, no lesions Neck: stiff, tender bilat, ROM reduced in all directions moderately, no lymphadenopathy, no thyromegaly, no masses Heart: RRR, normal S1, S2, no murmurs Lungs: CTA bilaterally, no wheezes, rhonchi, or rales Back: mild paraspinal tenderness upper back bilat MSK: bilat elbows over olecranon with bony growth and small 2-3 mm round nodular mass most likely tophi, left anterior patella with bony growth, nodular, right medial knee joint line with nodularity and similar right ankle anteromedial portion of medial malleolus with bony growth also likely tophi Pulses normal UE and LE Neuro: normal strength, DTRs, sensation   Assessment: Encounter Diagnoses  Name Primary?  . Torticollis Yes  . Gout   . Gout tophi   . Knee pain, left   . Elbow pain, chronic     Plan: Torticollis - discussed diagnosis, treatment, advised rest, heat, massage, script for Flexeril, and can use his Indocin.  Should resolve with a little more time  Gout - increase to Allopurinol 400mg  daily.  Plan to recheck uric acid in 61mo.  Gout tophi on exam, pain and palpable abnormality of knees and elbows likely tophi, will send for xrays.  Request prior records regarding joint fluid studies.  Follow-up pending prior records and xrays

## 2012-07-06 NOTE — Patient Instructions (Addendum)
Torticollis, Acute You have suddenly (acutely) developed a twisted neck (torticollis). This is usually a self-limited condition. CAUSES  Acute torticollis may be caused by malposition, trauma or infection. Most commonly, acute torticollis is caused by sleeping in an awkward position. Torticollis may also be caused by the flexion, extension or twisting of the neck muscles beyond their normal position. Sometimes, the exact cause may not be known. SYMPTOMS  Usually, there is pain and limited movement of the neck. Your neck may twist to one side. DIAGNOSIS  The diagnosis is often made by physical examination. X-rays, CT scans or MRIs may be done if there is a history of trauma or concern of infection. TREATMENT  For a common, stiff neck that develops during sleep, treatment is focused on relaxing the contracted neck muscle. Medications (including shots) may be used to treat the problem. Most cases resolve in several days. Torticollis usually responds to conservative physical therapy. If left untreated, the shortened and spastic neck muscle can cause deformities in the face and neck. Rarely, surgery is required. HOME CARE INSTRUCTIONS   Use over-the-counter and prescription medications as directed by your caregiver.  Do stretching exercises and massage the neck as directed by your caregiver.  Follow up with physical therapy if needed and as directed by your caregiver. SEEK IMMEDIATE MEDICAL CARE IF:   You develop difficulty breathing or noisy breathing (stridor).  You drool, develop trouble swallowing or have pain with swallowing.  You develop numbness or weakness in the hands or feet.  You have changes in speech or vision.  You have problems with urination or bowel movements.  You have difficulty walking.  You have a fever.  You have increased pain. MAKE SURE YOU:   Understand these instructions.  Will watch your condition.  Will get help right away if you are not doing well or  get worse. Document Released: 05/27/2000 Document Revised: 08/22/2011 Document Reviewed: 07/08/2009 [facility] Patient Information 2013 [place], [place].    Gout - For flare ups,   Use Colchicine twice daily for 3-5 days until improved  Indocin 50mg , 1 tablet three times daily for 3-5 days until improved  Oxycodone 5mg , 1 tablet every 6 hours ONLY if the other medications still aren't giving you relief during a flare  Continue Allopurinol for prevention of gout flare, but now you we will be taking 300mg  tablet plus a 100mg  tablet daily.  For Torticollis  Use heat, massage to neck  You can use Flexeril muscle relaxer at bedtime or 3 times daily for spasm of muscle, but this may cause drowsiness

## 2012-07-17 ENCOUNTER — Encounter: Payer: Self-pay | Admitting: Medical

## 2012-07-17 ENCOUNTER — Ambulatory Visit (INDEPENDENT_AMBULATORY_CARE_PROVIDER_SITE_OTHER): Payer: BC Managed Care – PPO | Admitting: Medical

## 2012-07-17 ENCOUNTER — Ambulatory Visit
Admission: RE | Admit: 2012-07-17 | Discharge: 2012-07-17 | Disposition: A | Discharge status: Home / Self Care | Payer: BC Managed Care – PPO | Source: Ambulatory Visit | Attending: Medical | Admitting: Medical

## 2012-07-17 VITALS — BP 120/80 | HR 99 | Temp 98.2°F | Resp 16 | Wt 223.0 lb

## 2012-07-17 DIAGNOSIS — E790 Hyperuricemia without signs of inflammatory arthritis and tophaceous disease: Secondary | ICD-10-CM

## 2012-07-17 DIAGNOSIS — M436 Torticollis: Secondary | ICD-10-CM

## 2012-07-17 DIAGNOSIS — M542 Cervicalgia: Secondary | ICD-10-CM

## 2012-07-17 DIAGNOSIS — M2550 Pain in unspecified joint: Secondary | ICD-10-CM

## 2012-07-17 DIAGNOSIS — IMO0001 Reserved for inherently not codable concepts without codable children: Secondary | ICD-10-CM

## 2012-07-17 DIAGNOSIS — M791 Myalgia, unspecified site: Secondary | ICD-10-CM

## 2012-07-17 DIAGNOSIS — R7989 Other specified abnormal findings of blood chemistry: Secondary | ICD-10-CM

## 2012-07-17 LAB — COMPREHENSIVE METABOLIC PANEL
ALT: 30 U/L (ref 0–53)
AST: 17 U/L (ref 0–37)
Albumin: 4.9 g/dL (ref 3.5–5.2)
BUN: 16 mg/dL (ref 6–23)
CO2: 28 mEq/L (ref 19–32)
Calcium: 10 mg/dL (ref 8.4–10.5)
Chloride: 102 mEq/L (ref 96–112)
Potassium: 4.4 mEq/L (ref 3.5–5.3)

## 2012-07-17 LAB — CK: Total CK: 107 U/L (ref 7–232)

## 2012-07-17 LAB — URIC ACID: Uric Acid, Serum: 4.6 mg/dL (ref 4.0–7.8)

## 2012-07-17 NOTE — Progress Notes (Signed)
Subjective: Here for recheck.  i saw him recently for joint issues, left knee pain and swelling, neck stiffness and swelling.   Despite NSAIDs, muscle relaxer, heat and rest, the neck is still stiff with limited ROM. Still having pain in left knee, feels like the knee buckles at times.  Neck pain x 15mo+, radiates down to shoulders and back.   R>L.  Still having ankle pain, worse with walking.  Here to discuss his recently xrays.  He also reports aches and soreness in his chest wall over the weekend.    Past Medical History  Diagnosis Date  . Hyperlipidemia   . Hypertension   . Allergy   . Arthritis   . Atopic dermatitis   . Amputation of thumb     left, s/p conveyer belt injury  . 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 Gout   . Substance abuse     marijuania weekly   Review of Systems Constitutional: -fever, -chills, -sweats, -unexpected weight change,-fatigue Cardiology:  -chest pain, -palpitations, -edema Respiratory: -cough, -shortness of breath, -wheezing Gastroenterology: -abdominal pain, -nausea, -vomiting, -diarrhea, -constipation  Hematology: -bleeding or bruising problems Ophthalmology: -vision changes Urology: -dysuria, -difficulty urinating, -hematuria, -urinary frequency, -urgency Neurology: -headache, -weakness, +tingling and numbness right 4th and 5th fingers at times   Objective: General appearance: alert, no distress, WD/WN  Oral cavity: MMM, no lesions  Neck: stiff, tender bilat, ROM reduced in all directions moderately, no lymphadenopathy, no thyromegaly, no masses  Heart: RRR, normal S1, S2, no murmurs  Lungs: CTA bilaterally, no wheezes, rhonchi, or rales  Back: mild paraspinal tenderness throughout, +spasm, normal back ROM MSK: bilat elbows over olecranon with bony growth and small 2-3 mm round nodular mass most likely tophi, left anterior patella with bony growth, nodular, right medial knee joint line with nodularity and similar right ankle anteromedial portion of  medial malleolus with bony growth also likely tophi  Pulses normal UE and LE  Neuro: normal strength, DTRs, sensation     Assessment:  Encounter Diagnoses  Name Primary?  . Torticollis Yes  . Cervicalgia   . Myalgia   . Hyperuricemia   . Polyarthralgia     Plan Additional labs today, C spine xray, f/u pending studies.

## 2012-07-18 ENCOUNTER — Other Ambulatory Visit: Payer: Self-pay | Admitting: Medical

## 2012-07-18 ENCOUNTER — Encounter: Payer: Self-pay | Admitting: Medical

## 2012-07-18 DIAGNOSIS — M549 Dorsalgia, unspecified: Secondary | ICD-10-CM

## 2012-07-18 DIAGNOSIS — M5384 Other specified dorsopathies, thoracic region: Secondary | ICD-10-CM

## 2012-07-18 DIAGNOSIS — M4322 Fusion of spine, cervical region: Secondary | ICD-10-CM

## 2012-07-19 ENCOUNTER — Ambulatory Visit
Admission: RE | Admit: 2012-07-19 | Discharge: 2012-07-19 | Disposition: A | Discharge status: Home / Self Care | Payer: BC Managed Care – PPO | Source: Ambulatory Visit | Attending: Medical | Admitting: Medical

## 2012-07-19 ENCOUNTER — Telehealth: Payer: Self-pay

## 2012-07-19 ENCOUNTER — Other Ambulatory Visit: Payer: Self-pay | Admitting: Medical

## 2012-07-19 DIAGNOSIS — M5384 Other specified dorsopathies, thoracic region: Secondary | ICD-10-CM

## 2012-07-19 DIAGNOSIS — M549 Dorsalgia, unspecified: Secondary | ICD-10-CM

## 2012-07-19 DIAGNOSIS — M4322 Fusion of spine, cervical region: Secondary | ICD-10-CM

## 2012-07-19 NOTE — Progress Notes (Signed)
Quick Note:  PT HAS BEEN INFORMED WORD FOR WORD Labs were fine. 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 This certainly helps explain his decreased ROM. Given this abnormality, lets have him go back for T and L spine xrays. I put orders in the computer. Pending these xrays, I'm either going to refer to therapy, or possible to a specialist to help with diagnosis and management.    Lorain Childes - he is out of work, in pain and limited ROM, thus it would helpful for him to go and get these xrays Thursday so I can get the ball rolling with treatment. thanks  PT VERBALIZED UNDERSTANDING AND SAID HE WAS GOING TODAY ______

## 2012-07-19 NOTE — Telephone Encounter (Signed)
WHEN I CALLED PT TO GIVE RESULTS OF LABS AND X-RAY PT WANTED ME TO SEND YOU A NOTE THAT HIS LEFT LEG FROM HIS KNEE DOWN IS SWOLLEN AND HE IS ON CRUTCHES HE JUST WANTED YOU  TO KNOW

## 2012-07-20 ENCOUNTER — Other Ambulatory Visit: Payer: Self-pay | Admitting: Medical

## 2012-07-20 ENCOUNTER — Telehealth: Payer: Self-pay | Admitting: Internal Medicine

## 2012-07-20 ENCOUNTER — Encounter: Payer: Self-pay | Admitting: Medical

## 2012-07-20 MED ORDER — INDOMETHACIN 50 MG PO CAPS: 50.0000 mg | ORAL_CAPSULE | Freq: Three times a day (TID) | ORAL | Status: DC

## 2012-07-20 NOTE — Telephone Encounter (Signed)
done

## 2012-07-24 ENCOUNTER — Telehealth: Payer: Self-pay | Admitting: Family Medicine

## 2012-07-24 NOTE — Telephone Encounter (Signed)
Patient is aware of his appointment at [facility] on 07/30/12 @ 900 am. CLS

## 2012-07-30 ENCOUNTER — Telehealth: Payer: Self-pay | Admitting: Family Medicine

## 2012-07-30 ENCOUNTER — Encounter: Payer: Self-pay | Admitting: Medical

## 2012-07-30 ENCOUNTER — Other Ambulatory Visit: Payer: Self-pay | Admitting: Medical

## 2012-07-30 ENCOUNTER — Ambulatory Visit (INDEPENDENT_AMBULATORY_CARE_PROVIDER_SITE_OTHER): Payer: BC Managed Care – PPO | Admitting: Medical

## 2012-07-30 ENCOUNTER — Ambulatory Visit
Admission: RE | Admit: 2012-07-30 | Discharge: 2012-07-30 | Disposition: A | Discharge status: Home / Self Care | Payer: BC Managed Care – PPO | Source: Ambulatory Visit | Attending: Medical | Admitting: Medical

## 2012-07-30 VITALS — BP 112/80 | HR 78 | Temp 97.8°F | Resp 16 | Wt 222.0 lb

## 2012-07-30 DIAGNOSIS — S59909A Unspecified injury of unspecified elbow, initial encounter: Secondary | ICD-10-CM

## 2012-07-30 DIAGNOSIS — M1990 Unspecified osteoarthritis, unspecified site: Secondary | ICD-10-CM

## 2012-07-30 DIAGNOSIS — W009XXA Unspecified fall due to ice and snow, initial encounter: Secondary | ICD-10-CM

## 2012-07-30 DIAGNOSIS — W19XXXA Unspecified fall, initial encounter: Secondary | ICD-10-CM

## 2012-07-30 DIAGNOSIS — S6990XA Unspecified injury of unspecified wrist, hand and finger(s), initial encounter: Secondary | ICD-10-CM

## 2012-07-30 DIAGNOSIS — S6991XA Unspecified injury of right wrist, hand and finger(s), initial encounter: Secondary | ICD-10-CM

## 2012-07-30 DIAGNOSIS — R52 Pain, unspecified: Secondary | ICD-10-CM

## 2012-07-30 DIAGNOSIS — M542 Cervicalgia: Secondary | ICD-10-CM

## 2012-07-30 DIAGNOSIS — M25539 Pain in unspecified wrist: Secondary | ICD-10-CM

## 2012-07-30 DIAGNOSIS — W010XXA Fall on same level from slipping, tripping and stumbling without subsequent striking against object, initial encounter: Secondary | ICD-10-CM

## 2012-07-30 DIAGNOSIS — M25531 Pain in right wrist: Secondary | ICD-10-CM

## 2012-07-30 MED ORDER — INDOMETHACIN 50 MG PO CAPS: 50.0000 mg | ORAL_CAPSULE | Freq: Three times a day (TID) | ORAL | Status: DC

## 2012-07-30 MED ORDER — OXYCODONE HCL 5 MG PO TABS: 5.0000 mg | ORAL_TABLET | Freq: Four times a day (QID) | ORAL | Status: DC | PRN

## 2012-07-30 NOTE — Progress Notes (Signed)
Subjective: Here for injury.   Was walking on snow and ice over the weekend, slipped and landed backwards on his buttocks and right hand.   Since then has had decreased right wrist ROM, pain, some swelling.  Can't lift anything with the hand.  Neck pain, ROM a little improved, but still having lots of pain in neck, bilat knees, left ankle, not very improved despite current medications.  Also here to discuss his recent xrays, referral.  Past Medical History  Diagnosis Date  . Hyperlipidemia   . Hypertension   . Allergy   . Arthritis   . Atopic dermatitis   . Amputation of thumb     left, s/p conveyer belt injury  . 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 Gout   . Substance abuse     marijuania weekly    Objective: Gen: wd, wn, nad Skin: warm, no erythema or ecchymosis MSK: right wrist swollen, tender, decreaed ROM in general.  Rest of forearm, elbow, fingers nontender, normal ROM.  Right shoulder ROM normal today. Neck: somewhat improved ROM compared to last visit.    Pulses normal Neuro: normal right upper extremity strength, sensation, DTRs.    Assessment:  Encounter Diagnoses  Name Primary?  . Right wrist injury Yes  . Wrist pain, right   . Fall from slipping on ice   . Neck pain   . Osteoarthritis    Plan: Right wrist pain, injury, fall - scripts for arm sling and reinforced wrist splint.  Will send for right wrist xray.  If no fracture, advised 5-7 days of rest, ice, elevation of the arm, wrist splint and can use arm sling every couple of hours.  We will call with xray results.   Neck pain, OA - discussed his recent xray results,  Joint pains, current medication therapy.   i have referred him to PT for neck and back pain, referral to rheumatology for additional evaluation of his joints issues.   For the time being until he sees rheumatology, can use Indocin 2-3 time daily, Oxycodone for severe pain only, and advised that this would not be an ongoing medication, just prn use.   C/t rest of medications as usual.

## 2012-07-30 NOTE — Patient Instructions (Signed)
Until you see the orthopedist, go ahead and take 3 times daily every day for arthritis pain.    You can use the Oxycodone pain tablet, 1 tablet every 6 hours as needed for severe pain.  The colchicine is just for gout flare ups, not daily.  All other medications continue as usual.    Go for right wrist xray today.   If there is no fracture, then I recommend you use rest, ice, elevation of the arm, wrist splint and arm sling for the next 3-5 days until it improves.  If there is a fracture, we will call with recommendations.

## 2012-07-30 NOTE — Telephone Encounter (Signed)
Patient has an appointment with Dr. Bedelia Person on 08/22/12 at [facility]. CLS

## 2012-08-21 ENCOUNTER — Telehealth: Payer: Self-pay | Admitting: Family Medicine

## 2012-08-21 NOTE — Telephone Encounter (Signed)
Pt called and stated [facility] just called and changed his appt from tomorrow to 09/24/12.  Pt said he is in pain and his neck is swollen, can you get [facility] to get him in earlier or call a different Ortho?  Please let pt know. [phone]

## 2012-08-22 NOTE — Telephone Encounter (Signed)
Called [facility] and the Dr. that he is schedule with, is out this week with on an emergency and will be back next week and new pt appts are all booked up this month but the lady did say that they have cancellations all the time and that the pt can call everyday to see if they can work him in. Pt was notified and gave number to pt. [phone]

## 2013-01-16 ENCOUNTER — Encounter (HOSPITAL_BASED_OUTPATIENT_CLINIC_OR_DEPARTMENT_OTHER)
Admission: RE | Disposition: A | Discharge status: Home / Self Care | Payer: Self-pay | Source: Ambulatory Visit | Attending: Orthopedic Surgery

## 2013-01-16 ENCOUNTER — Emergency Department (INDEPENDENT_AMBULATORY_CARE_PROVIDER_SITE_OTHER): Payer: BC Managed Care – PPO

## 2013-01-16 ENCOUNTER — Encounter (HOSPITAL_BASED_OUTPATIENT_CLINIC_OR_DEPARTMENT_OTHER): Payer: Self-pay | Admitting: Anesthesiology

## 2013-01-16 ENCOUNTER — Ambulatory Visit (HOSPITAL_BASED_OUTPATIENT_CLINIC_OR_DEPARTMENT_OTHER)
Admission: RE | Admit: 2013-01-16 | Discharge: 2013-01-16 | Disposition: A | Discharge status: Home / Self Care | Payer: BC Managed Care – PPO | Source: Ambulatory Visit | Attending: Orthopedic Surgery | Admitting: Orthopedic Surgery

## 2013-01-16 ENCOUNTER — Ambulatory Visit (HOSPITAL_BASED_OUTPATIENT_CLINIC_OR_DEPARTMENT_OTHER): Payer: BC Managed Care – PPO | Admitting: Anesthesiology

## 2013-01-16 ENCOUNTER — Emergency Department (INDEPENDENT_AMBULATORY_CARE_PROVIDER_SITE_OTHER)
Admission: EM | Admit: 2013-01-16 | Discharge: 2013-01-16 | Disposition: A | Discharge status: Nursing Home (Includes ICF) | Payer: BC Managed Care – PPO | Source: Home / Self Care

## 2013-01-16 ENCOUNTER — Encounter (HOSPITAL_BASED_OUTPATIENT_CLINIC_OR_DEPARTMENT_OTHER): Payer: Self-pay | Admitting: Orthopedic Surgery

## 2013-01-16 ENCOUNTER — Encounter (HOSPITAL_COMMUNITY): Payer: Self-pay | Admitting: *Deleted

## 2013-01-16 DIAGNOSIS — M109 Gout, unspecified: Secondary | ICD-10-CM | POA: Insufficient documentation

## 2013-01-16 DIAGNOSIS — S61209A Unspecified open wound of unspecified finger without damage to nail, initial encounter: Secondary | ICD-10-CM

## 2013-01-16 DIAGNOSIS — F17200 Nicotine dependence, unspecified, uncomplicated: Secondary | ICD-10-CM | POA: Insufficient documentation

## 2013-01-16 DIAGNOSIS — W268XXA Contact with other sharp object(s), not elsewhere classified, initial encounter: Secondary | ICD-10-CM | POA: Insufficient documentation

## 2013-01-16 DIAGNOSIS — J309 Allergic rhinitis, unspecified: Secondary | ICD-10-CM | POA: Insufficient documentation

## 2013-01-16 DIAGNOSIS — Z7982 Long term (current) use of aspirin: Secondary | ICD-10-CM | POA: Insufficient documentation

## 2013-01-16 DIAGNOSIS — Y929 Unspecified place or not applicable: Secondary | ICD-10-CM | POA: Insufficient documentation

## 2013-01-16 DIAGNOSIS — W01119A Fall on same level from slipping, tripping and stumbling with subsequent striking against unspecified sharp object, initial encounter: Secondary | ICD-10-CM | POA: Insufficient documentation

## 2013-01-16 DIAGNOSIS — S61219A Laceration without foreign body of unspecified finger without damage to nail, initial encounter: Secondary | ICD-10-CM

## 2013-01-16 DIAGNOSIS — M1990 Unspecified osteoarthritis, unspecified site: Secondary | ICD-10-CM | POA: Insufficient documentation

## 2013-01-16 DIAGNOSIS — Z79899 Other long term (current) drug therapy: Secondary | ICD-10-CM | POA: Insufficient documentation

## 2013-01-16 DIAGNOSIS — M129 Arthropathy, unspecified: Secondary | ICD-10-CM | POA: Insufficient documentation

## 2013-01-16 DIAGNOSIS — I10 Essential (primary) hypertension: Secondary | ICD-10-CM | POA: Insufficient documentation

## 2013-01-16 DIAGNOSIS — F1210 Cannabis abuse, uncomplicated: Secondary | ICD-10-CM | POA: Insufficient documentation

## 2013-01-16 DIAGNOSIS — E785 Hyperlipidemia, unspecified: Secondary | ICD-10-CM | POA: Insufficient documentation

## 2013-01-16 HISTORY — PX: TENDON EXPLORATION: SHX5112

## 2013-01-16 LAB — POCT I-STAT, CHEM 8
BUN: 18 mg/dL (ref 6–23)
Calcium, Ion: 1.24 mmol/L — ABNORMAL HIGH (ref 1.12–1.23)
Chloride: 106 mEq/L (ref 96–112)
Creatinine, Ser: 1.4 mg/dL — ABNORMAL HIGH (ref 0.50–1.35)
Sodium: 143 mEq/L (ref 135–145)
TCO2: 23 mmol/L (ref 0–100)

## 2013-01-16 SURGERY — EXPLORATION, TENDON
Anesthesia: General | Site: Finger | Laterality: Right | Wound class: Contaminated

## 2013-01-16 MED ORDER — HYDROMORPHONE HCL PF 1 MG/ML IJ SOLN
INTRAMUSCULAR | Status: AC
  Filled 2013-01-16: qty 2

## 2013-01-16 MED ORDER — ONDANSETRON 4 MG PO TBDP
4.0000 mg | ORAL_TABLET | Freq: Once | ORAL | Status: AC
  Administered 2013-01-16: 4 mg via ORAL

## 2013-01-16 MED ORDER — DEXAMETHASONE SODIUM PHOSPHATE 10 MG/ML IJ SOLN
INTRAMUSCULAR | Status: DC | PRN
  Administered 2013-01-16: 10 mg via INTRAVENOUS

## 2013-01-16 MED ORDER — OXYCODONE HCL 5 MG PO TABS: 5.0000 mg | ORAL_TABLET | Freq: Once | ORAL | Status: DC | PRN

## 2013-01-16 MED ORDER — CEFAZOLIN SODIUM-DEXTROSE 2-3 GM-% IV SOLR
INTRAVENOUS | Status: DC | PRN
  Administered 2013-01-16: 2 g via INTRAVENOUS

## 2013-01-16 MED ORDER — ONDANSETRON HCL 4 MG/2ML IJ SOLN
INTRAMUSCULAR | Status: DC | PRN
  Administered 2013-01-16: 4 mg via INTRAVENOUS

## 2013-01-16 MED ORDER — CHLORHEXIDINE GLUCONATE 4 % EX LIQD: 60.0000 mL | Freq: Once | CUTANEOUS | Status: DC

## 2013-01-16 MED ORDER — ONDANSETRON 4 MG PO TBDP
ORAL_TABLET | ORAL | Status: AC
  Filled 2013-01-16: qty 1

## 2013-01-16 MED ORDER — HYDROMORPHONE HCL 1 MG/ML IJ SOLN
2.0000 mg | Freq: Once | INTRAMUSCULAR | Status: AC
  Administered 2013-01-16: 2 mg via INTRAMUSCULAR

## 2013-01-16 MED ORDER — LACTATED RINGERS IV SOLN
INTRAVENOUS | Status: DC
  Administered 2013-01-16: 11:00:00 via INTRAVENOUS

## 2013-01-16 MED ORDER — FENTANYL CITRATE 0.05 MG/ML IJ SOLN
50.0000 ug | INTRAMUSCULAR | Status: DC | PRN
  Administered 2013-01-16: 100 ug via INTRAVENOUS

## 2013-01-16 MED ORDER — OXYCODONE HCL 5 MG/5ML PO SOLN: 5.0000 mg | Freq: Once | ORAL | Status: DC | PRN

## 2013-01-16 MED ORDER — LIDOCAINE HCL (CARDIAC) 20 MG/ML IV SOLN
INTRAVENOUS | Status: DC | PRN
  Administered 2013-01-16: 100 mg via INTRAVENOUS

## 2013-01-16 MED ORDER — MIDAZOLAM HCL 2 MG/2ML IJ SOLN
1.0000 mg | INTRAMUSCULAR | Status: DC | PRN
  Administered 2013-01-16: 2 mg via INTRAVENOUS

## 2013-01-16 MED ORDER — DEXAMETHASONE SODIUM PHOSPHATE 4 MG/ML IJ SOLN
INTRAMUSCULAR | Status: DC | PRN
  Administered 2013-01-16: 4 mg

## 2013-01-16 MED ORDER — PROPOFOL 10 MG/ML IV BOLUS
INTRAVENOUS | Status: DC | PRN
  Administered 2013-01-16: 200 mg via INTRAVENOUS
  Administered 2013-01-16: 100 mg via INTRAVENOUS

## 2013-01-16 MED ORDER — HYDROCODONE-ACETAMINOPHEN 5-325 MG PO TABS: 1.0000 | ORAL_TABLET | Freq: Four times a day (QID) | ORAL | Status: DC | PRN

## 2013-01-16 MED ORDER — HYDROMORPHONE HCL PF 1 MG/ML IJ SOLN: 0.2500 mg | INTRAMUSCULAR | Status: DC | PRN

## 2013-01-16 MED ORDER — ONDANSETRON HCL 4 MG/2ML IJ SOLN: 4.0000 mg | Freq: Once | INTRAMUSCULAR | Status: DC | PRN

## 2013-01-16 MED ORDER — BUPIVACAINE-EPINEPHRINE PF 0.5-1:200000 % IJ SOLN
INTRAMUSCULAR | Status: DC | PRN
  Administered 2013-01-16: 25 mL

## 2013-01-16 MED ORDER — CEPHALEXIN 250 MG PO CAPS: 250.0000 mg | ORAL_CAPSULE | Freq: Four times a day (QID) | ORAL | Status: DC

## 2013-01-16 SURGICAL SUPPLY — 80 items
BAG DECANTER FOR FLEXI CONT (MISCELLANEOUS) IMPLANT
BALL CTTN LRG ABS STRL LF (GAUZE/BANDAGES/DRESSINGS)
BANDAGE GAUZE ELAST BULKY 4 IN (GAUZE/BANDAGES/DRESSINGS) ×2 IMPLANT
BLADE MINI RND TIP GREEN BEAV (BLADE) IMPLANT
BLADE SURG 15 STRL LF DISP TIS (BLADE) ×1 IMPLANT
BLADE SURG 15 STRL SS (BLADE) ×2
BNDG CMPR 9X4 STRL LF SNTH (GAUZE/BANDAGES/DRESSINGS) ×1
BNDG COHESIVE 3X5 TAN STRL LF (GAUZE/BANDAGES/DRESSINGS) ×2 IMPLANT
BNDG ESMARK 4X9 LF (GAUZE/BANDAGES/DRESSINGS) ×1 IMPLANT
CHLORAPREP W/TINT 26ML (MISCELLANEOUS) ×1 IMPLANT
CLOTH BEACON ORANGE TIMEOUT ST (SAFETY) ×2 IMPLANT
CORDS BIPOLAR (ELECTRODE) ×2 IMPLANT
COTTONBALL LRG STERILE PKG (GAUZE/BANDAGES/DRESSINGS) IMPLANT
COVER MAYO STAND STRL (DRAPES) ×2 IMPLANT
COVER TABLE BACK 60X90 (DRAPES) ×2 IMPLANT
CUFF TOURNIQUET SINGLE 18IN (TOURNIQUET CUFF) ×1 IMPLANT
DECANTER SPIKE VIAL GLASS SM (MISCELLANEOUS) IMPLANT
DRAIN TLS ROUND 10FR (DRAIN) IMPLANT
DRAPE EXTREMITY T 121X128X90 (DRAPE) ×2 IMPLANT
DRAPE OEC MINIVIEW 54X84 (DRAPES) IMPLANT
DRAPE SURG 17X23 STRL (DRAPES) ×2 IMPLANT
DRSG KUZMA FLUFF (GAUZE/BANDAGES/DRESSINGS) IMPLANT
GAUZE SPONGE 4X4 16PLY XRAY LF (GAUZE/BANDAGES/DRESSINGS) IMPLANT
GAUZE XEROFORM 1X8 LF (GAUZE/BANDAGES/DRESSINGS) ×2 IMPLANT
GLOVE BIO SURGEON STRL SZ 6.5 (GLOVE) ×2 IMPLANT
GLOVE BIOGEL PI IND STRL 7.0 (GLOVE) IMPLANT
GLOVE BIOGEL PI IND STRL 8.5 (GLOVE) ×1 IMPLANT
GLOVE BIOGEL PI INDICATOR 7.0 (GLOVE) ×2
GLOVE BIOGEL PI INDICATOR 8.5 (GLOVE) ×1
GLOVE ECLIPSE 6.5 STRL STRAW (GLOVE) ×1 IMPLANT
GLOVE SURG ORTHO 8.0 STRL STRW (GLOVE) ×2 IMPLANT
GOWN BRE IMP PREV XXLGXLNG (GOWN DISPOSABLE) ×2 IMPLANT
GOWN PREVENTION PLUS XLARGE (GOWN DISPOSABLE) ×3 IMPLANT
KWIRE 4.0 X .035IN (WIRE) IMPLANT
LOOP VESSEL MAXI BLUE (MISCELLANEOUS) IMPLANT
NDL KEITH (NEEDLE) IMPLANT
NEEDLE 27GAX1X1/2 (NEEDLE) ×1 IMPLANT
NEEDLE HYPO 22GX1.5 SAFETY (NEEDLE) IMPLANT
NEEDLE KEITH (NEEDLE) IMPLANT
NS IRRIG 1000ML POUR BTL (IV SOLUTION) ×2 IMPLANT
PACK BASIN DAY SURGERY FS (CUSTOM PROCEDURE TRAY) ×2 IMPLANT
PAD CAST 3X4 CTTN HI CHSV (CAST SUPPLIES) ×1 IMPLANT
PADDING CAST ABS 3INX4YD NS (CAST SUPPLIES)
PADDING CAST ABS 4INX4YD NS (CAST SUPPLIES) ×1
PADDING CAST ABS COTTON 3X4 (CAST SUPPLIES) IMPLANT
PADDING CAST ABS COTTON 4X4 ST (CAST SUPPLIES) ×1 IMPLANT
PADDING CAST COTTON 3X4 STRL (CAST SUPPLIES) ×2
SLEEVE SCD COMPRESS KNEE MED (MISCELLANEOUS) IMPLANT
SPLINT PLASTER CAST XFAST 3X15 (CAST SUPPLIES) IMPLANT
SPLINT PLASTER XTRA FASTSET 3X (CAST SUPPLIES) ×10
SPONGE GAUZE 4X4 12PLY (GAUZE/BANDAGES/DRESSINGS) ×2 IMPLANT
STOCKINETTE 4X48 STRL (DRAPES) ×2 IMPLANT
SUT CHROMIC 5 0 P 3 (SUTURE) IMPLANT
SUT ETHIBOND 3-0 V-5 (SUTURE) IMPLANT
SUT FIBERWIRE 2-0 18 17.9 3/8 (SUTURE)
SUT FIBERWIRE 4-0 18 TAPR NDL (SUTURE)
SUT MERSILENE 2.0 SH NDLE (SUTURE) IMPLANT
SUT MERSILENE 3 0 FS 1 (SUTURE) IMPLANT
SUT MERSILENE 4 0 P 3 (SUTURE) ×1 IMPLANT
SUT POLY BUTTON 15MM (SUTURE) IMPLANT
SUT PROLENE 2 0 SH DA (SUTURE) IMPLANT
SUT SILK 2 0 FS (SUTURE) IMPLANT
SUT SILK 4 0 PS 2 (SUTURE) IMPLANT
SUT STEEL 3 0 (SUTURE) IMPLANT
SUT STEEL 4 0 V 26 (SUTURE) IMPLANT
SUT VIC AB 3-0 PS1 18 (SUTURE)
SUT VIC AB 3-0 PS1 18XBRD (SUTURE) IMPLANT
SUT VIC AB 4-0 P-3 18XBRD (SUTURE) IMPLANT
SUT VIC AB 4-0 P3 18 (SUTURE)
SUT VICRYL 4-0 PS2 18IN ABS (SUTURE) IMPLANT
SUT VICRYL RAPID 5 0 P 3 (SUTURE) IMPLANT
SUT VICRYL RAPIDE 4/0 PS 2 (SUTURE) ×2 IMPLANT
SUTURE FIBERWR 2-0 18 17.9 3/8 (SUTURE) IMPLANT
SUTURE FIBERWR 4-0 18 TAPR NDL (SUTURE) IMPLANT
SYR BULB 3OZ (MISCELLANEOUS) ×2 IMPLANT
SYR CONTROL 10ML LL (SYRINGE) ×1 IMPLANT
TOWEL OR 17X24 6PK STRL BLUE (TOWEL DISPOSABLE) ×3 IMPLANT
TRAY DSU PREP LF (CUSTOM PROCEDURE TRAY) ×1 IMPLANT
TUBE FEEDING 5FR 15 INCH (TUBING) IMPLANT
UNDERPAD 30X30 INCONTINENT (UNDERPADS AND DIAPERS) ×2 IMPLANT

## 2013-01-16 NOTE — Op Note (Signed)
Dictation Number [id number]

## 2013-01-16 NOTE — Op Note (Signed)
NAMEDURREL, MCNEE NO.:  [id number]  MEDICAL RECORD NO.:  [id number]  LOCATION:                                 FACILITY:  PHYSICIAN:  Cindee Salt, M.D.       DATE OF BIRTH:  09-Apr-1961  DATE OF PROCEDURE:  01/16/2013 DATE OF DISCHARGE:                              OPERATIVE REPORT   PREOPERATIVE DIAGNOSIS:  Lacerations, right ring and small fingers.  POSTOPERATIVE DIAGNOSIS:  Lacerations, right ring and small fingers.  OPERATION:  Exploration with sharp debridement of lacerations with repair of central slip, tendon lacerations ring and small fingers, right hand.  SURGEON:  Cindee Salt, M.D.  ASSISTANT:  None.  ANESTHESIA:  Supraclavicular block general.  ANESTHESIOLOGIST:  Sheldon Silvan, M.D.  HISTORY:  The patient is a 52 year old male who was carrying a picture frame, he stumbled, fell, the picture frame glass broke suffering lacerations over the dorsal aspect of his ring and little fingers, laceration over the ulnar aspect of the ring finger.  X-rays are negative.  He has mild pain to extension.  Sensation and circulation are otherwise intact.  He is brought in for exploration repairs under regional anesthesia.  He is aware of risks and complications including infection, injury to arteries, nerves, tendons.  In the preoperative area, the patient is seen, the extremity marked by both the patient and surgeon.  Antibiotic given.  PROCEDURE IN DETAIL:  The patient was brought to the operating room where a supraclavicular block general anesthetic were each carried out without difficulty.  He was prepped using Betadine scrub and solution with the right arm free.  A 3-minute dry time was allowed.  Time-out taken, confirming the patient and procedure.  The limb was exsanguinated with an Esmarch bandage.  Tourniquet was placed high on the arm was inflated to 250 mmHg.  The little finger was attended to first.  The avulsion of skin was explored.  A  laceration to the central slip was noted.  This did not fully penetrate into the joint.  This was copiously irrigated with saline.  The repair was then performed with figure-of- eight 4-0 Mersilene sutures.  The circular laceration was converted to an ellipse and closed primarily with interrupted 4-0 Vicryl Rapide sutures.  The ring finger was attended to next.  The laceration over the dorsal aspect of the PIP joint similar to the small finger was explored, the laceration to the central slip was noted, this was elliptical in nature.  This was opened, the joint flexed.  The joint was noted be penetrated.  This was copiously irrigated with saline.  The tendon was then repaired with figure-of-eight 4-0 Mersilene sutures.  The circular laceration was debrided sharply and closed with an ellipse conversion with interrupted 4-0 Vicryl Rapide.  The laceration on the ulnar aspect of the finger was then explored, this was found to go in the subcutaneous tissue and then onto the dorsum, but no proximal laceration to the extensor tendon was noted.  The wound was extended proximally for further evaluation.  The wound was then copiously irrigated with saline and closed with interrupted 4-0 Vicryl Rapide sutures.  Sterile compressive dressing and splint to  the finger was applied leaving the index and middle free.  On deflation of the tourniquet, all fingers immediately pinked.  He was taken to the recovery room for observation in satisfactory condition.  He will be discharged home to return to the [facility] [place] in 1 week on Norco and Keflex.          ______________________________ Cindee Salt, M.D.     GK/MEDQ  D:  01/16/2013  T:  01/16/2013  Job:  [id number]

## 2013-01-16 NOTE — ED Notes (Signed)
Pt  Reports  sev  Hours  Ago  He  Felled  Lac  Two  Fingers   On  The  r      Hand          -  Bleeding  Has  Subsided      Pt  States        He  Put  It  Through a   Licensed conveyancer

## 2013-01-16 NOTE — Anesthesia Postprocedure Evaluation (Signed)
  Anesthesia Post-op Note  Patient: Troy Stevens  Procedure(s) Performed: Procedure(s): TENDON EXPLORATION AND REPAIRS RIGHT RING AND SMALL FINGERS  (Right)  Patient Location: PACU  Anesthesia Type:GA combined with regional for post-op pain  Level of Consciousness: awake, alert  and oriented  Airway and Oxygen Therapy: Patient Spontanous Breathing  Post-op Pain: none  Post-op Assessment: Post-op Vital signs reviewed  Post-op Vital Signs: Reviewed  Complications: No apparent anesthesia complications

## 2013-01-16 NOTE — Anesthesia Preprocedure Evaluation (Signed)
Anesthesia Evaluation  Patient identified by MRN, date of birth, ID band Patient awake    Reviewed: Allergy & Precautions, H&P , NPO status , Patient's Chart, lab work & pertinent test results  Airway Mallampati: I TM Distance: >3 FB Neck ROM: Full    Dental  (+) Teeth Intact and Dental Advisory Given   Pulmonary  breath sounds clear to auscultation        Cardiovascular hypertension, Pt. on medications Rhythm:Regular Rate:Normal     Neuro/Psych    GI/Hepatic   Endo/Other    Renal/GU      Musculoskeletal   Abdominal   Peds  Hematology   Anesthesia Other Findings   Reproductive/Obstetrics                           Anesthesia Physical Anesthesia Plan  ASA: II  Anesthesia Plan: General   Post-op Pain Management:    Induction: Intravenous  Airway Management Planned: LMA  Additional Equipment:   Intra-op Plan:   Post-operative Plan: Extubation in OR  Informed Consent: I have reviewed the patients History and Physical, chart, labs and discussed the procedure including the risks, benefits and alternatives for the proposed anesthesia with the patient or authorized representative who has indicated his/her understanding and acceptance.   Dental advisory given  Plan Discussed with: Anesthesiologist, CRNA and Surgeon  Anesthesia Plan Comments:         Anesthesia Quick Evaluation

## 2013-01-16 NOTE — Transfer of Care (Signed)
Immediate Anesthesia Transfer of Care Note  Patient: Troy Stevens  Procedure(s) Performed: Procedure(s): TENDON EXPLORATION AND REPAIRS RIGHT RING AND SMALL FINGERS  (Right)  Patient Location: PACU  Anesthesia Type:GA combined with regional for post-op pain  Level of Consciousness: awake, alert  and oriented  Airway & Oxygen Therapy: Patient Spontanous Breathing and Patient connected to face mask oxygen  Post-op Assessment: Report given to PACU RN and Post -op Vital signs reviewed and stable  Post vital signs: Reviewed and stable  Complications: No apparent anesthesia complications

## 2013-01-16 NOTE — Anesthesia Procedure Notes (Addendum)
Procedure Name: LMA Insertion Date/Time: 01/16/2013 12:00 PM Performed by: Burna Cash Pre-anesthesia Checklist: Patient identified, Emergency Drugs available, Suction available and Patient being monitored Patient Re-evaluated:Patient Re-evaluated prior to inductionOxygen Delivery Method: Circle System Utilized Preoxygenation: Pre-oxygenation with 100% oxygen Intubation Type: IV induction Ventilation: Mask ventilation without difficulty LMA: LMA inserted LMA Size: 5.0 Number of attempts: 1 Airway Equipment and Method: bite block Placement Confirmation: positive ETCO2 Tube secured with: Tape Dental Injury: Teeth and Oropharynx as per pre-operative assessment    Anesthesia Regional Block:  Supraclavicular block  Pre-Anesthetic Checklist: ,, timeout performed, Correct Patient, Correct Site, Correct Laterality, Correct Procedure, Correct Position, site marked, Risks and benefits discussed,  Surgical consent,  Pre-op evaluation,  At surgeon's request and post-op pain management  Laterality: Right and Upper  Prep: chloraprep       Needles:  Injection technique: Single-shot  Needle Type: Echogenic Stimulator Needle     Needle Length: 5cm 5 cm Needle Gauge: 21    Additional Needles:  Procedures: ultrasound guided (picture in chart) Supraclavicular block Narrative:  Start time: 01/16/2013 11:37 AM End time: 01/16/2013 11:43 AM Injection made incrementally with aspirations every 5 mL.  Performed by: Personally  Anesthesiologist: Sheldon Silvan  Supraclavicular block

## 2013-01-16 NOTE — Brief Op Note (Signed)
01/16/2013  12:41 PM  PATIENT:  Troy Stevens  52 y.o. male  PRE-OPERATIVE DIAGNOSIS:  right ring and small finger  lacerations  POST-OPERATIVE DIAGNOSIS:  Lacerated tendons right ring and small fingers  PROCEDURE:  Procedure(s): TENDON EXPLORATION AND REPAIRS RIGHT RING AND SMALL FINGERS  (Right)  SURGEON:  Surgeon(s) and Role:    * Nicki Reaper, MD - Primary  PHYSICIAN ASSISTANT:   ASSISTANTS: none   ANESTHESIA:   regional and general  EBL:  Total I/O In: 800 [I.V.:800] Out: -   BLOOD ADMINISTERED:none  DRAINS: none   LOCAL MEDICATIONS USED:  NONE  SPECIMEN:  No Specimen  DISPOSITION OF SPECIMEN:  N/A  COUNTS:  YES  TOURNIQUET:   Total Tourniquet Time Documented: Upper Arm (Right) - 28 minutes Total: Upper Arm (Right) - 28 minutes   DICTATION: .Other Dictation: Dictation Number [id number]  PLAN OF CARE: Discharge to home after PACU  PATIENT DISPOSITION:  PACU - hemodynamically stable.

## 2013-01-16 NOTE — ED Provider Notes (Signed)
CSN: [id number]     Arrival date & time 01/16/13  0808 History     First MD Initiated Contact with Patient 01/16/13 [phone]     Chief Complaint  Patient presents with  . Laceration   (Consider location/radiation/quality/duration/timing/severity/associated sxs/prior Treatment) HPI  52 yo bm presents with right ring finger lacerations.  States that this morning he stumbled and fell into a picture frame with his right hand.  Suffered lacerations to ring finger with profuse bleeding.  Moderate pain currently.  Up to date on tetanus per patient.     Past Medical History  Diagnosis Date  . Hyperlipidemia   . Hypertension   . Allergy   . Arthritis   . Atopic dermatitis   . Amputation of thumb     left, s/p conveyer belt injury  . 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 Gout   . Substance abuse     marijuania weekly   Past Surgical History  Procedure Laterality Date  . Knee surgery  2001    left, arthroscopic  . Forearm surgery      left trauma repair and skin graft s/p conveyer belt injury  . Skin graft      removed from left thigh, transplanted to left forearm  . Mouth surgery    . Thumb amputation     Family History  Problem Relation Age of Onset  . Other Mother     died in MVA  . Other Father     died in MVA  . Arthritis Brother   . Hypertension Sister   . Hyperlipidemia Paternal Aunt   . Stroke Paternal Grandfather   . Heart disease Neg Hx   . Cancer Neg Hx   . Colon cancer Neg Hx   . Stomach cancer Neg Hx    History  Substance Use Topics  . Smoking status: Current Some Day Smoker -- 0.25 packs/day for 20 years    Types: Cigarettes, Cigars  . Smokeless tobacco: Never Used  . Alcohol Use: 0.6 oz/week    1 Cans of beer per week     Comment: 1-2 beers a week    Review of Systems  Constitutional: Negative.   HENT: Negative.   Eyes: Negative.   Respiratory: Negative.   Cardiovascular: Negative.   Gastrointestinal: Negative.   Endocrine: Negative.   Genitourinary:  Negative.   Musculoskeletal: Negative.   Skin: Positive for wound.  Neurological: Negative.   Psychiatric/Behavioral: Negative.     Allergies  Review of patient's allergies indicates no known allergies.  Home Medications   Current Outpatient Rx  Name  Route  Sig  Dispense  Refill  . allopurinol (ZYLOPRIM) 100 MG tablet   Oral   Take 1 tablet (100 mg total) by mouth daily.   30 tablet   5   . allopurinol (ZYLOPRIM) 300 MG tablet   Oral   Take 1 tablet (300 mg total) by mouth daily.   30 tablet   5   . atorvastatin (LIPITOR) 20 MG tablet   Oral   Take 1 tablet (20 mg total) by mouth daily.   30 tablet   4   . colchicine 0.6 MG tablet   Oral   Take 1 tablet (0.6 mg total) by mouth daily.   60 tablet   2   . cyclobenzaprine (FLEXERIL) 10 MG tablet   Oral   Take 1 tablet (10 mg total) by mouth 3 (three) times daily as needed for muscle spasms.   30 tablet  0   . indomethacin (INDOCIN) 50 MG capsule   Oral   Take 1 capsule (50 mg total) by mouth 3 (three) times daily with meals.   60 capsule   1   . lisinopril (PRINIVIL,ZESTRIL) 20 MG tablet   Oral   Take 1 tablet (20 mg total) by mouth daily.   30 tablet   3   . oxyCODONE (OXY IR/ROXICODONE) 5 MG immediate release tablet   Oral   Take 1 tablet (5 mg total) by mouth every 6 (six) hours as needed for pain.   30 tablet   0   . EXPIRED: sildenafil (VIAGRA) 50 MG tablet   Oral   Take 1 tablet (50 mg total) by mouth daily as needed for erectile dysfunction.   10 tablet   0    BP 133/84  Pulse 61  Temp(Src) 97.7 F (36.5 C) (Oral)  Resp 18  SpO2 98% Physical Exam  Constitutional: He is oriented to person, place, and time. He appears well-developed and well-nourished.  HENT:  Head: Normocephalic and atraumatic.  Eyes: EOM are normal. Pupils are equal, round, and reactive to light.  Neck: Normal range of motion.  Pulmonary/Chest: Effort normal.  Musculoskeletal: He exhibits tenderness.  Finger  flexor and extensor tendons appear intact.  Unable to get a good assessment of the wounds.  2-3 cm laceration ulnar index finger.  ? Extension into PIP joint.  Smaller lacerations dorsal index finger over PIP joint.  Some bleeding.  Sensation intact.   Neurological: He is alert and oriented to person, place, and time.  Skin: Skin is warm.  Psychiatric: He has a normal mood and affect.    ED Course   Procedures (including critical care time)  Labs Reviewed - No data to display No results found. 1. Finger laceration, initial encounter     MDM  Finger soaked in saline.  xrays done.  Will send over to [facility] for dr Cindee Salt to evaluate.  Patient aware of treatment plan.    Zonia Kief, PA-C 01/16/13 1008

## 2013-01-16 NOTE — ED Notes (Signed)
Pt  Remains npo    

## 2013-01-16 NOTE — H&P (Signed)
Troy Stevens is a 52 yo male who suffeerd a fall holding a picture . The glass broke and he suffered a laceration to his right hand ring and small fingers. He has a prior injury to his left hand with amputation of thumb.  PMH; Allergy: none Surgery amputation left thumb skin graft, knee  Meds: allopurinol lipitor colchicine flexeril indocine lisinopril viagra  FH non contriburory SH: smoke: cigars ETOH: beer ROS elevated bp, gout arthritis  Troy Stevens is an 52 y.o. male.   Chief Complaint: lac rt hand HPI: see above  Past Medical History  Diagnosis Date  . Hyperlipidemia   . Hypertension   . Allergy   . Arthritis   . Atopic dermatitis   . Amputation of thumb     left, s/p conveyer belt injury  . 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 Gout   . Substance abuse     marijuania weekly    Past Surgical History  Procedure Laterality Date  . Knee surgery  2001    left, arthroscopic  . Forearm surgery      left trauma repair and skin graft s/p conveyer belt injury  . Skin graft      removed from left thigh, transplanted to left forearm  . Mouth surgery    . Thumb amputation      Family History  Problem Relation Age of Onset  . Other Mother     died in MVA  . Other Father     died in MVA  . Arthritis Brother   . Hypertension Sister   . Hyperlipidemia Paternal Aunt   . Stroke Paternal Grandfather   . Heart disease Neg Hx   . Cancer Neg Hx   . Colon cancer Neg Hx   . Stomach cancer Neg Hx    Social History:  reports that he has been smoking Cigarettes and Cigars.  He has a 5 pack-year smoking history. He has never used smokeless tobacco. He reports that he drinks about 0.6 ounces of alcohol per week. He reports that he uses illicit drugs (Marijuana).  Allergies: No Known Allergies  Medications Prior to Admission  Medication Sig Dispense Refill  . allopurinol (ZYLOPRIM) 100 MG tablet Take 1 tablet (100 mg total) by mouth daily.  30 tablet  5  . allopurinol (ZYLOPRIM) 300  MG tablet Take 1 tablet (300 mg total) by mouth daily.  30 tablet  5  . aspirin 81 MG tablet Take 81 mg by mouth daily.      Marland Kitchen atorvastatin (LIPITOR) 20 MG tablet Take 1 tablet (20 mg total) by mouth daily.  30 tablet  4  . colchicine 0.6 MG tablet Take 1 tablet (0.6 mg total) by mouth daily.  60 tablet  2  . lisinopril (PRINIVIL,ZESTRIL) 20 MG tablet Take 1 tablet (20 mg total) by mouth daily.  30 tablet  3  . cyclobenzaprine (FLEXERIL) 10 MG tablet Take 1 tablet (10 mg total) by mouth 3 (three) times daily as needed for muscle spasms.  30 tablet  0  . indomethacin (INDOCIN) 50 MG capsule Take 1 capsule (50 mg total) by mouth 3 (three) times daily with meals.  60 capsule  1  . oxyCODONE (OXY IR/ROXICODONE) 5 MG immediate release tablet Take 1 tablet (5 mg total) by mouth every 6 (six) hours as needed for pain.  30 tablet  0  . sildenafil (VIAGRA) 50 MG tablet Take 1 tablet (50 mg total) by mouth daily as needed for erectile dysfunction.  10 tablet  0    No results found for this or any previous visit (from the past 48 hour(s)).  Dg Hand Complete Right  01/16/2013   *RADIOLOGY REPORT*  Clinical Data: Pain post trauma  RIGHT HAND - COMPLETE 3+ VIEW  Comparison: Right wrist July 30, 2012  Findings: Frontal, oblique, lateral views were obtained.  There is no fracture or dislocation.  There is no radiopaque foreign body.  There is advanced osteoarthritic change throughout the radiocarpal joint region.  There is also moderate osteoarthritic change in the midcarpal row.  There is mild osteoarthritic change in all PIP and DIP joints.  No erosive change or bony destruction.  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  No fracture or dislocation.  No radiopaque foreign body.   Original Report Authenticated By: Bretta Bang, M.D.     Pertinent items are noted in HPI.  Blood pressure 151/91, pulse 49, temperature 97.9 F (36.6 C), temperature source Oral, resp.  rate 18, height 5\' 6"  (1.676 m), weight 102.173 kg (225 lb 4 oz), SpO2 97.00%.  General appearance: alert, cooperative and appears stated age Head: Normocephalic, without obvious abnormality Neck: no JVD Resp: clear to auscultation bilaterally Cardio: normal apical impulse GI: soft, non-tender; bowel sounds normal; no masses,  no organomegaly Extremities: laceration rt Pulses: 2+ and symmetric amputation left thumb Skin: Skin color, texture, turgor normal. No rashes or lesions Neurologic: Grossly normal Incision/Wound: Rt hand   Assessment/Plan Repair lac artery, tendon nerve as necessary  Dolly Harbach R 01/16/2013, 10:47 AM

## 2013-01-18 ENCOUNTER — Encounter (HOSPITAL_BASED_OUTPATIENT_CLINIC_OR_DEPARTMENT_OTHER): Payer: Self-pay | Admitting: Orthopedic Surgery

## 2013-01-18 NOTE — ED Provider Notes (Signed)
Medical screening examination/treatment/procedure(s) were performed by a resident physician or non-physician practitioner and as the supervising physician I was immediately available for consultation/collaboration.  Clementeen Graham, MD   Rodolph Bong, MD 01/18/13 0900

## 2013-01-23 ENCOUNTER — Other Ambulatory Visit: Payer: Self-pay | Admitting: Medical

## 2013-02-19 ENCOUNTER — Other Ambulatory Visit: Payer: Self-pay | Admitting: Medical

## 2013-03-18 ENCOUNTER — Other Ambulatory Visit: Payer: Self-pay | Admitting: Medical

## 2013-04-02 ENCOUNTER — Other Ambulatory Visit: Payer: Self-pay | Admitting: Medical

## 2013-04-04 ENCOUNTER — Telehealth: Payer: Self-pay | Admitting: Internal Medicine

## 2013-04-04 MED ORDER — ALLOPURINOL 300 MG PO TABS: 300.0000 mg | ORAL_TABLET | Freq: Every day | ORAL | Status: DC

## 2013-04-04 MED ORDER — ASPIRIN 81 MG PO TBEC: DELAYED_RELEASE_TABLET | ORAL | Status: DC

## 2013-04-04 MED ORDER — ATORVASTATIN CALCIUM 20 MG PO TABS: 20.0000 mg | ORAL_TABLET | Freq: Every day | ORAL | Status: DC

## 2013-04-04 NOTE — Telephone Encounter (Signed)
Pt has no insurance and needs med sent to [facility] on [address]

## 2013-05-29 ENCOUNTER — Ambulatory Visit (INDEPENDENT_AMBULATORY_CARE_PROVIDER_SITE_OTHER): Payer: PRIVATE HEALTH INSURANCE | Admitting: Medical

## 2013-05-29 ENCOUNTER — Encounter: Payer: Self-pay | Admitting: Medical

## 2013-05-29 VITALS — BP 142/90 | HR 78 | Temp 97.8°F | Resp 16 | Wt 237.0 lb

## 2013-05-29 DIAGNOSIS — N529 Male erectile dysfunction, unspecified: Secondary | ICD-10-CM

## 2013-05-29 DIAGNOSIS — Z79899 Other long term (current) drug therapy: Secondary | ICD-10-CM

## 2013-05-29 DIAGNOSIS — E785 Hyperlipidemia, unspecified: Secondary | ICD-10-CM

## 2013-05-29 DIAGNOSIS — M109 Gout, unspecified: Secondary | ICD-10-CM

## 2013-05-29 DIAGNOSIS — I10 Essential (primary) hypertension: Secondary | ICD-10-CM

## 2013-05-29 MED ORDER — INDOMETHACIN 50 MG PO CAPS: 50.0000 mg | ORAL_CAPSULE | Freq: Three times a day (TID) | ORAL | Status: DC

## 2013-05-29 MED ORDER — COLCHICINE 0.6 MG PO TABS: 0.6000 mg | ORAL_TABLET | Freq: Two times a day (BID) | ORAL | Status: DC

## 2013-05-29 MED ORDER — ALLOPURINOL 300 MG PO TABS: 300.0000 mg | ORAL_TABLET | Freq: Every day | ORAL | Status: DC

## 2013-05-29 MED ORDER — LISINOPRIL 20 MG PO TABS: 20.0000 mg | ORAL_TABLET | Freq: Every day | ORAL | Status: DC

## 2013-05-29 MED ORDER — ATORVASTATIN CALCIUM 20 MG PO TABS: 20.0000 mg | ORAL_TABLET | Freq: Every day | ORAL | Status: DC

## 2013-05-29 MED ORDER — ASPIRIN 81 MG PO TBEC: DELAYED_RELEASE_TABLET | ORAL | Status: DC

## 2013-05-29 MED ORDER — SILDENAFIL CITRATE 100 MG PO TABS: 50.0000 mg | ORAL_TABLET | Freq: Every day | ORAL | Status: DC | PRN

## 2013-05-29 NOTE — Progress Notes (Signed)
Subjective: Here for med check.  Ran out of medication.  He changed insurance and pharmacies since last visit, but ran out of medications about 2-3 wk ago.  Was exercising regularly until it got cold.  Plans to start back walking.  Trying to eat healthy. Here for recheck on hypertension, hyperlipidemia, gout, erectile dysfunction.  Up until 2 weeks ago he was compliant with medication without complaint.  No new problems.  No other c/o.  The following portions of the patient's history were reviewed and updated as appropriate: allergies, current medications, past family history, past medical history, past social history, past surgical history and problem list.  Past Medical History  Diagnosis Date  . Hyperlipidemia   . Hypertension   . Allergy   . Arthritis   . Atopic dermatitis   . Amputation of thumb     left, s/p conveyer belt injury  . 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 Gout   . Substance abuse     marijuania weekly   ROS as in subjective   Objective:   Physical Exam  General appearance: alert, no distress, WD/WN Heart: RRR, normal S1, S2, no murmurs Lungs: CTA bilaterally, no wheezes, rhonchi, or rales Extremities no edema Pulses normal   Assessment and Plan :    Encounter Diagnoses  Name Primary?  . Essential hypertension, benign Yes  . Hyperlipidemia   . Gout   . Erectile dysfunction   . Encounter for long-term (current) use of other medications    Restart all medications, of note Indocin and Viagra is when necessary use, rest of medications are daily.  Advise he not run out of his medications, continue regular exercise and healthy diet, return in 6 weeks fasting for labs, visit, and prostate check.  Advised he return in general every 4 months for routine followup

## 2013-08-18 IMAGING — CR DG THORACIC SPINE 3V
4 series · 4 of 4 positions shown · non-contrast
Comparison: None.

CLINICAL DATA: Back pain, decreased range of motion

THORACIC SPINE - 2 VIEW + SWIMMERS

[view not recorded (1 of 4)]
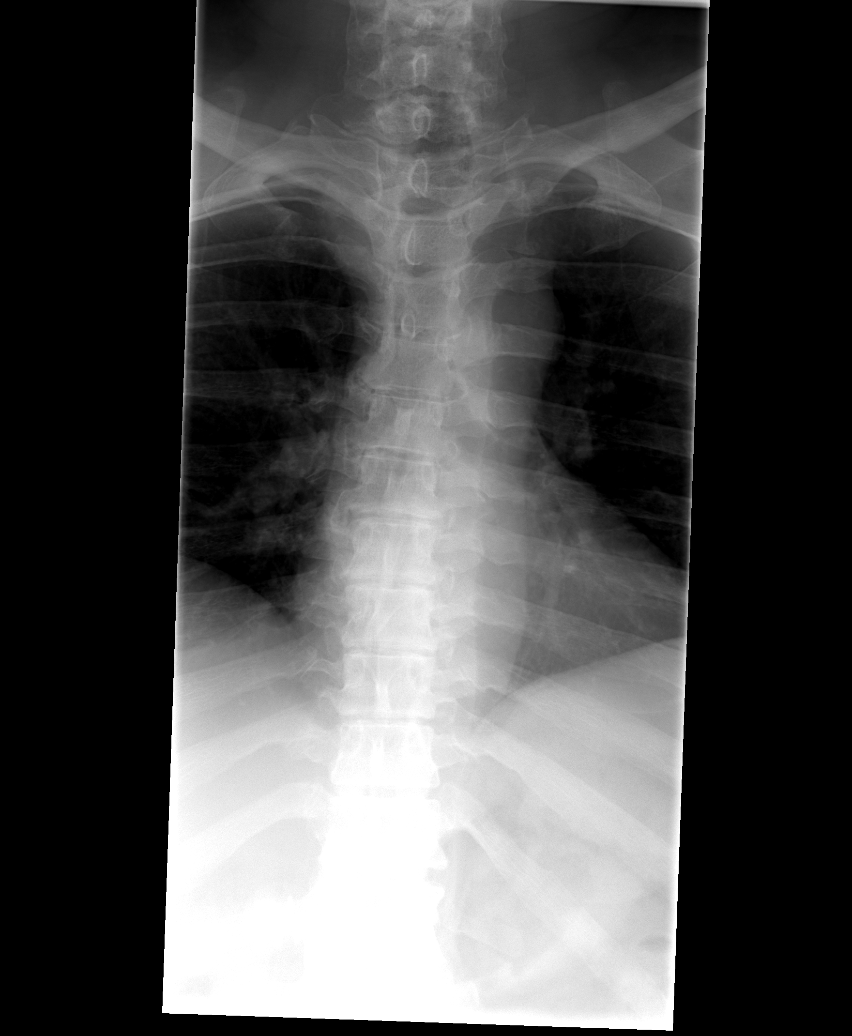

[view not recorded (2 of 4)]
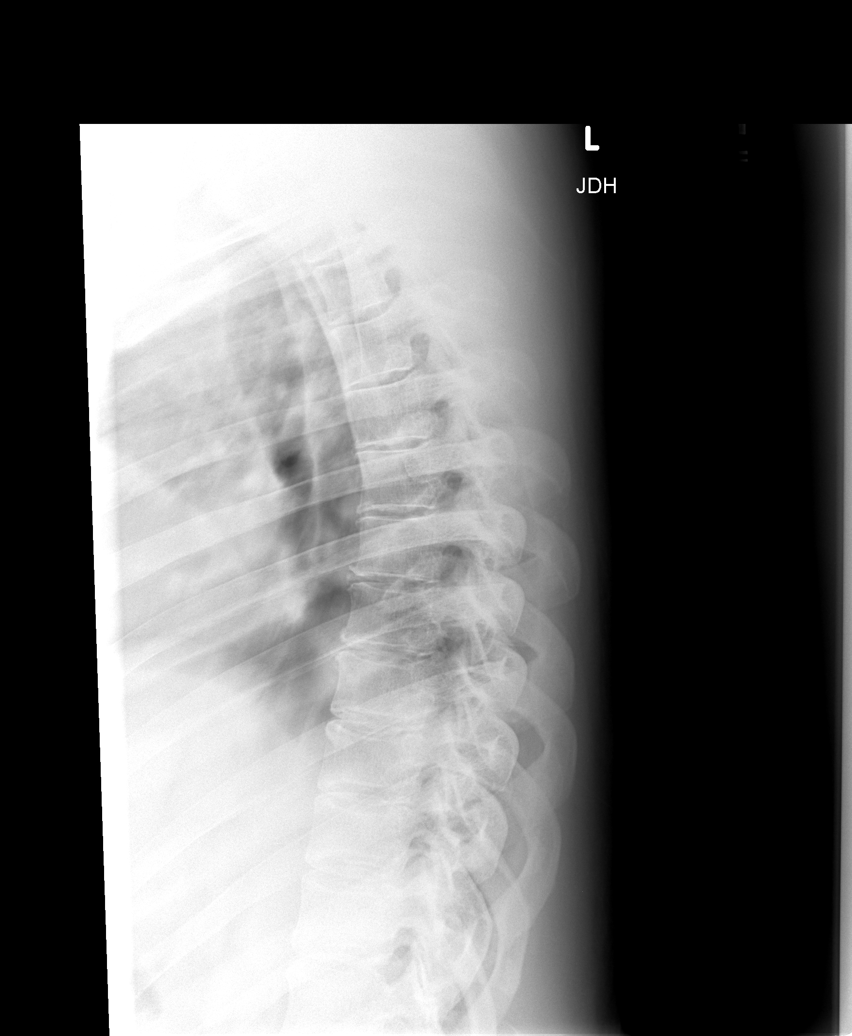

[view not recorded (3 of 4)]
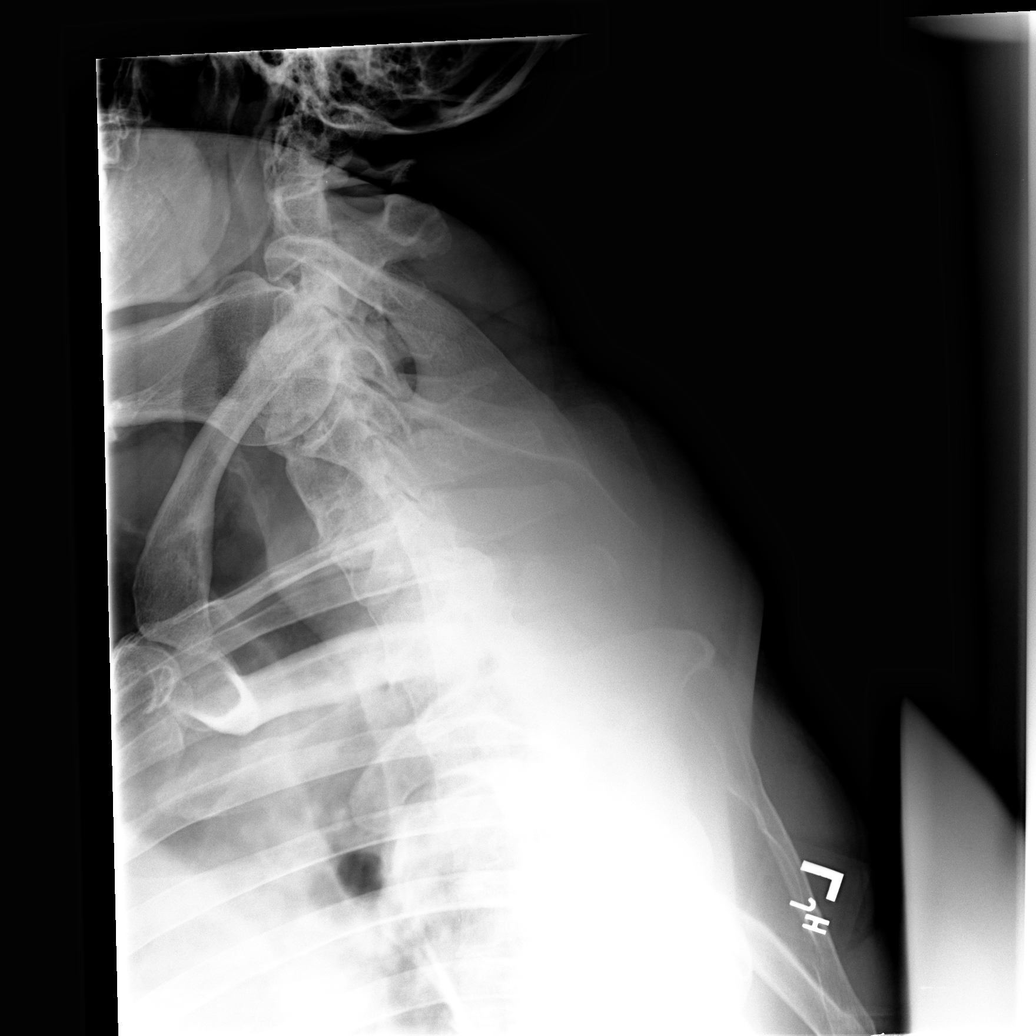

[view not recorded (4 of 4)]
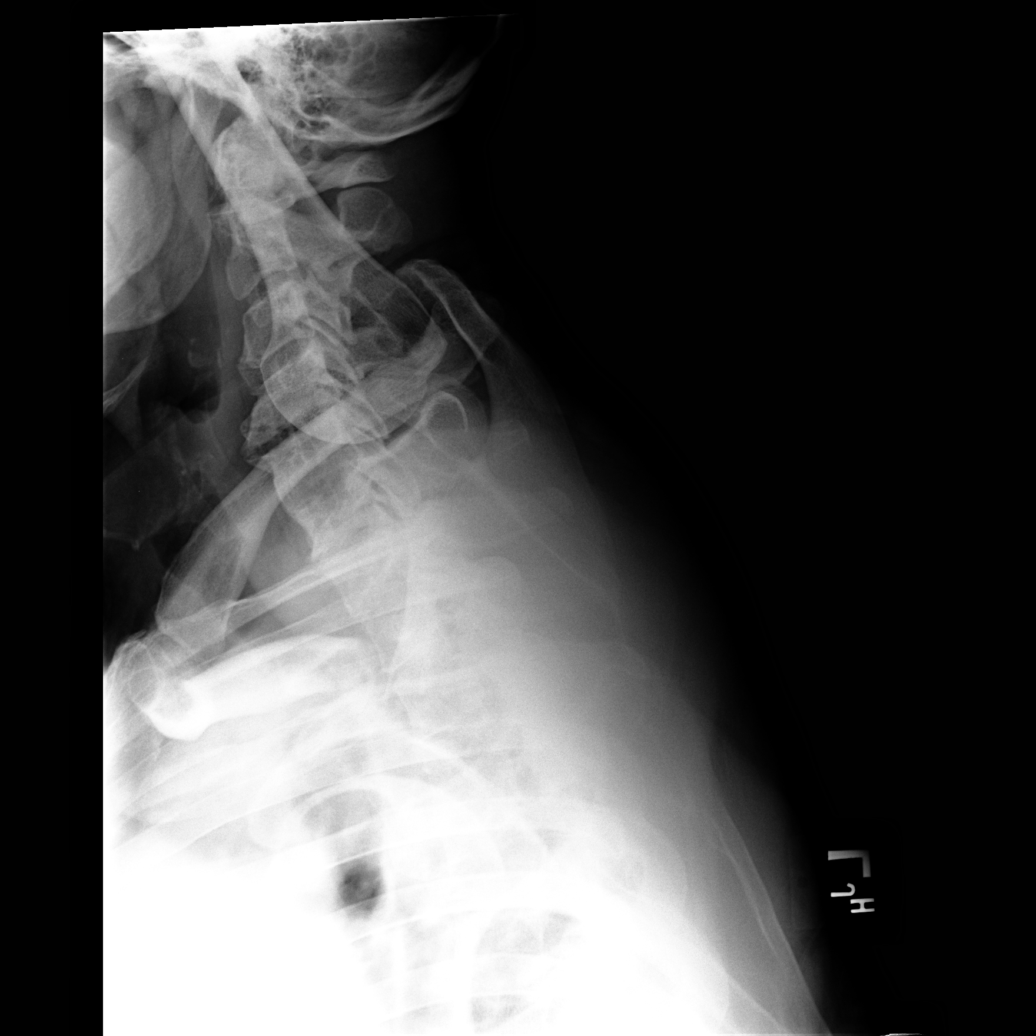

[4 of 4 positions shown; findings below may reference images not displayed]

FINDINGS: The thoracic vertebrae are in normal alignment.  Mild
degenerative change is noted in the mid to lower thoracic spine
with anterior osteophyte formation.  No compression deformity is
seen.
IMPRESSION: Only minimal anterior osteophyte formation in the mid to lower
thoracic spine.  No acute abnormality.

## 2013-08-29 IMAGING — CR DG WRIST COMPLETE 3+V*R*
4 series · 4 of 4 positions shown · non-contrast
Comparison: None.

CLINICAL DATA: Fell 2 days ago with pain and swelling

RIGHT WRIST - COMPLETE 3+ VIEW

[view not recorded (1 of 4)]
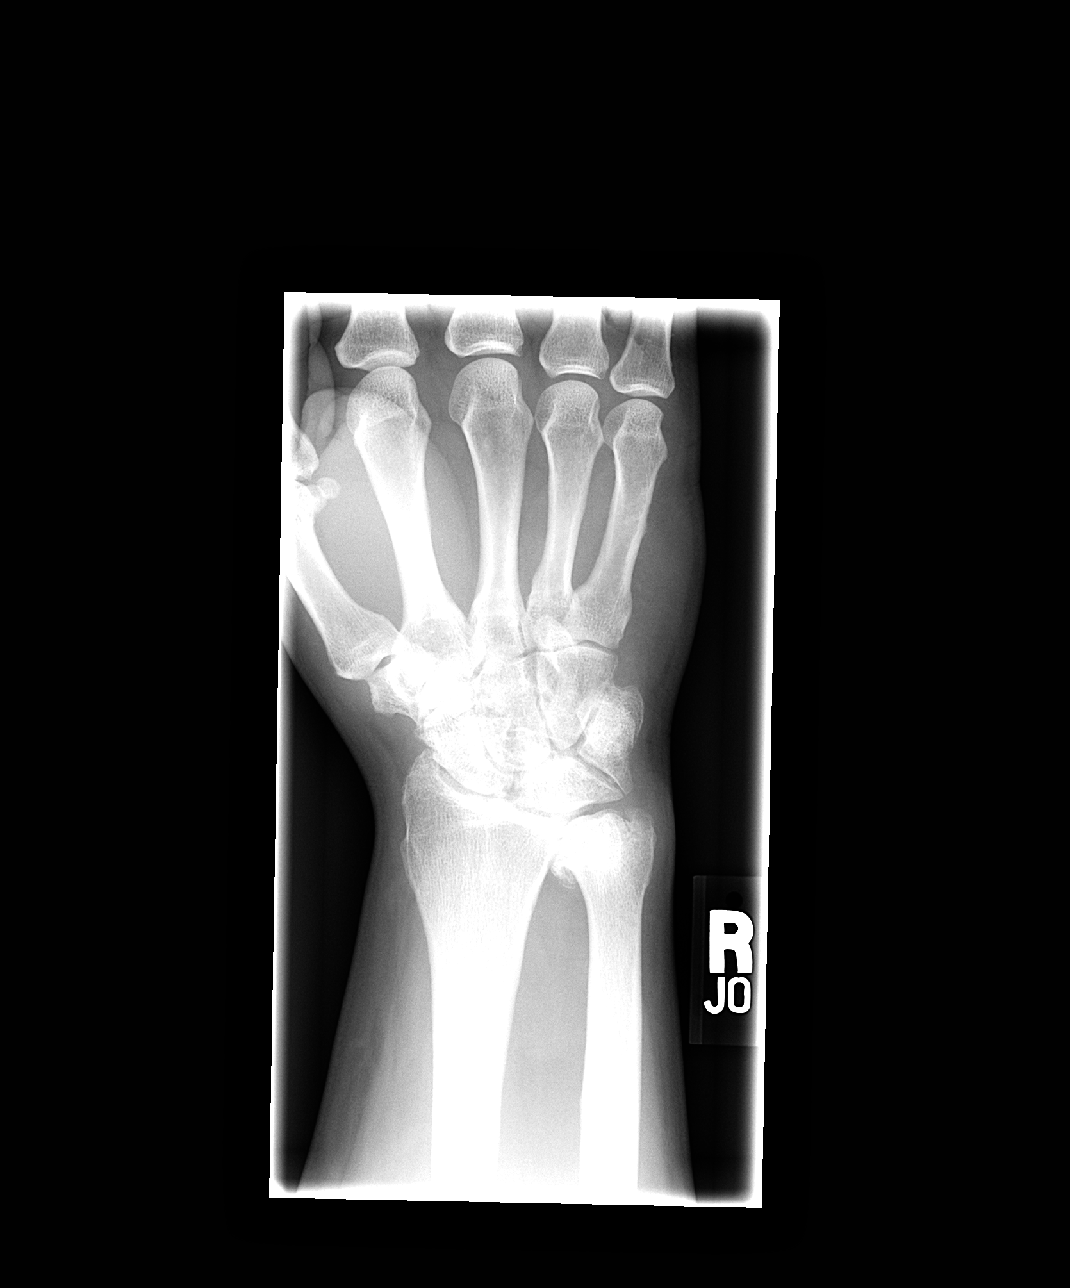

[view not recorded (2 of 4)]
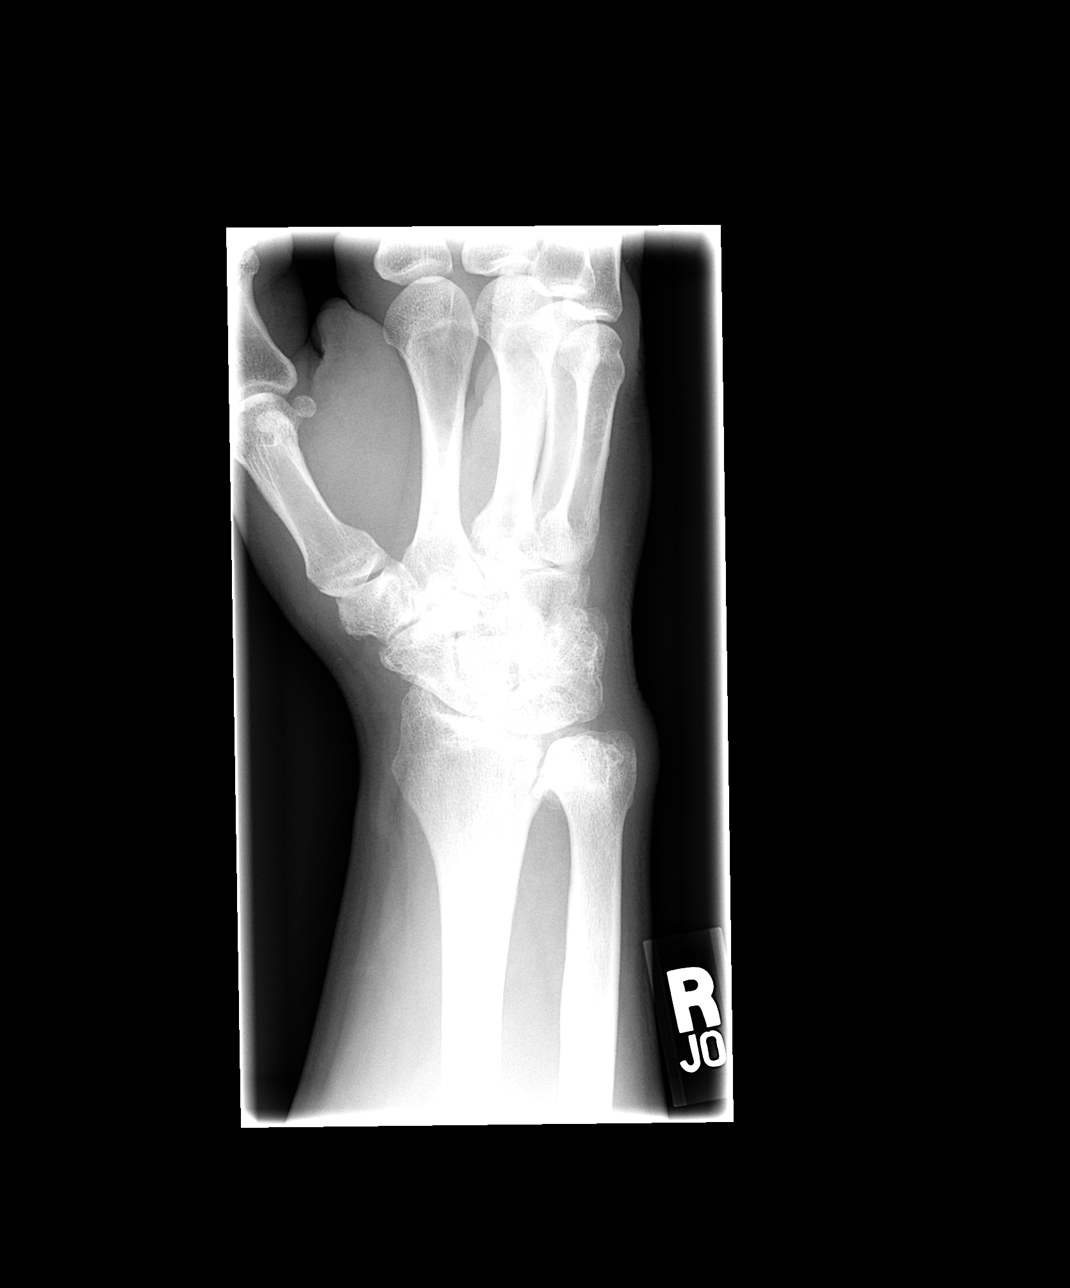

[view not recorded (3 of 4)]
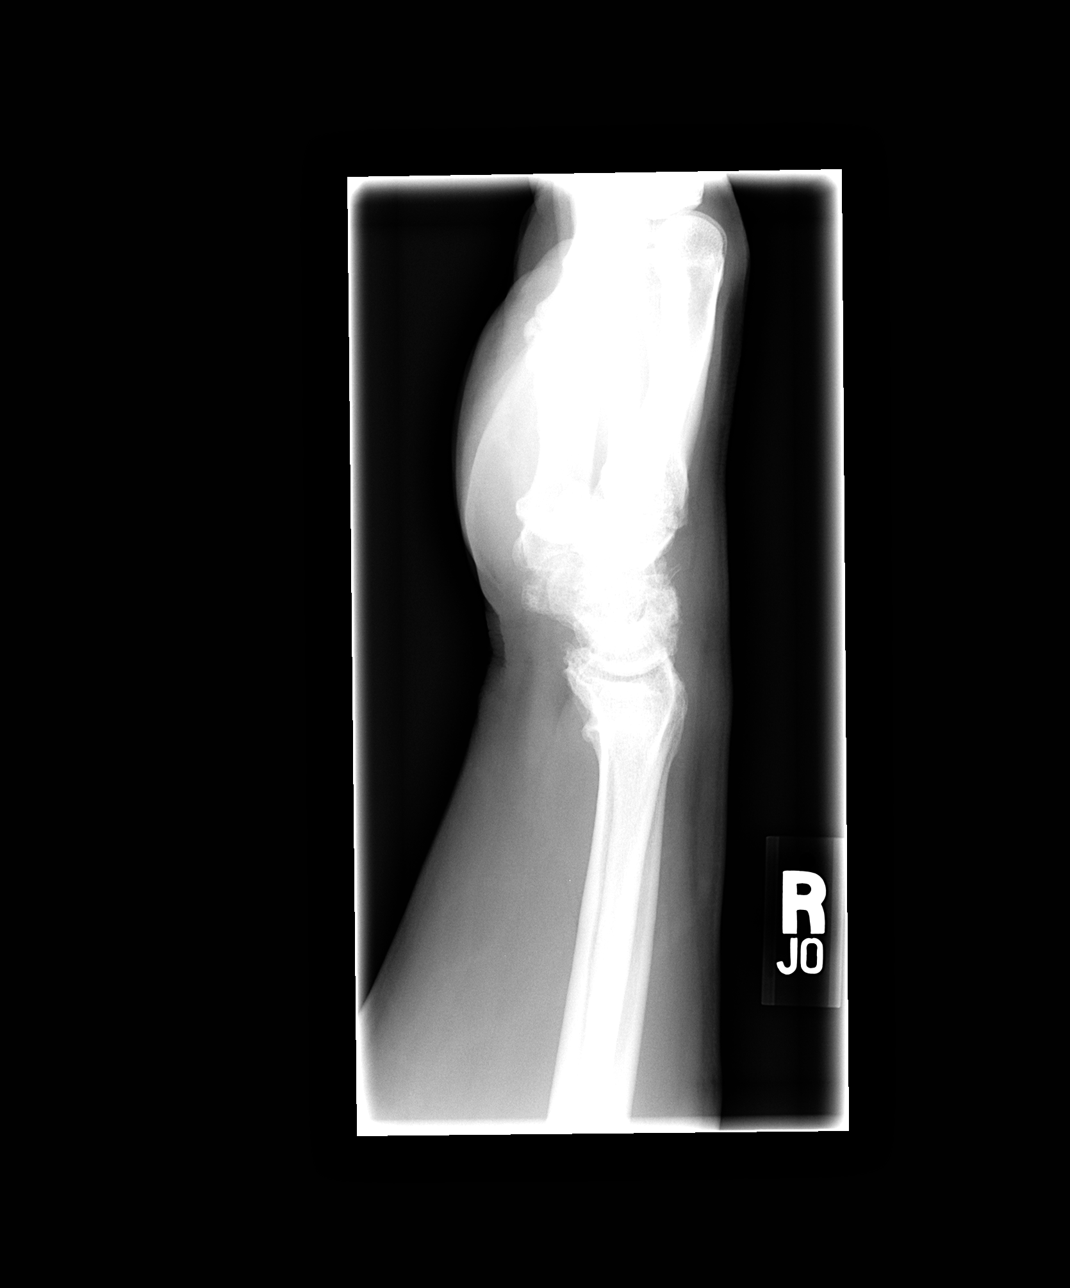

[view not recorded (4 of 4)]
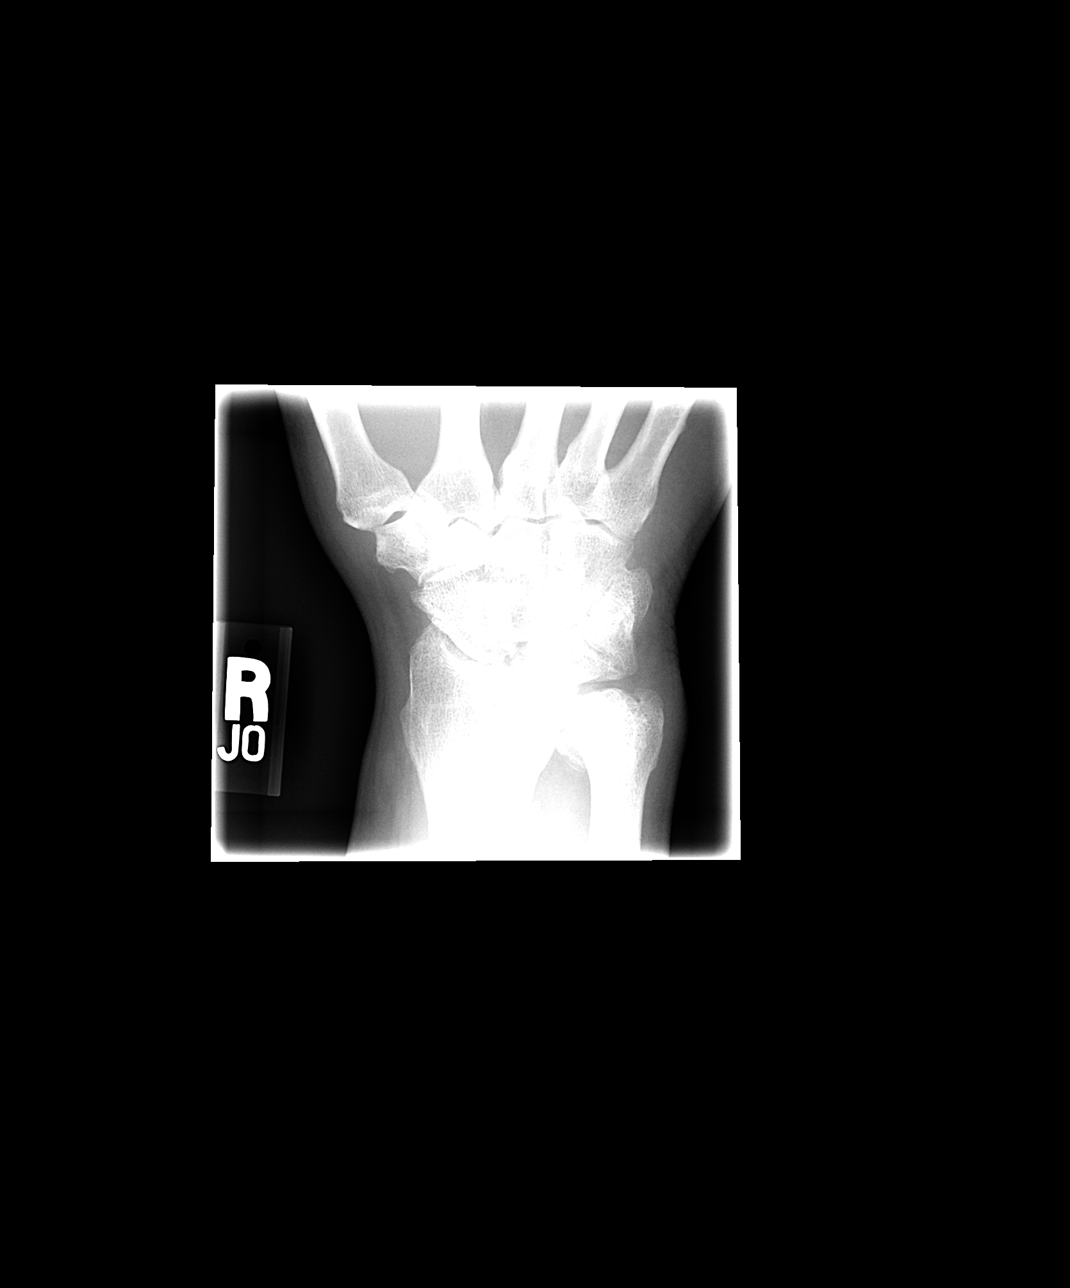

[4 of 4 positions shown; findings below may reference images not displayed]

FINDINGS: There is advanced degenerative change involving the
radiocarpal joint space and distal ulna which could be due to prior
trauma.  There is degenerative change throughout the carpal bones
with some loss of intercarpal space and sclerosis with spurring.
No acute fracture is evident.  Alignment is normal.
IMPRESSION: Age advanced degenerative change in the wrist. No acute fracture.

## 2015-02-12 ENCOUNTER — Encounter: Payer: PRIVATE HEALTH INSURANCE | Admitting: Medical

## 2015-03-02 ENCOUNTER — Encounter: Payer: Self-pay | Admitting: Medical

## 2015-03-02 ENCOUNTER — Ambulatory Visit (INDEPENDENT_AMBULATORY_CARE_PROVIDER_SITE_OTHER): Payer: Managed Care, Other (non HMO) | Admitting: Medical

## 2015-03-02 VITALS — BP 140/90 | HR 60 | Temp 97.9°F | Resp 18 | Ht 66.0 in | Wt 230.8 lb

## 2015-03-02 DIAGNOSIS — Z9119 Patient's noncompliance with other medical treatment and regimen: Secondary | ICD-10-CM | POA: Diagnosis not present

## 2015-03-02 DIAGNOSIS — N528 Other male erectile dysfunction: Secondary | ICD-10-CM

## 2015-03-02 DIAGNOSIS — Z720 Tobacco use: Secondary | ICD-10-CM | POA: Diagnosis not present

## 2015-03-02 DIAGNOSIS — L309 Dermatitis, unspecified: Secondary | ICD-10-CM

## 2015-03-02 DIAGNOSIS — Z23 Encounter for immunization: Secondary | ICD-10-CM | POA: Diagnosis not present

## 2015-03-02 DIAGNOSIS — E785 Hyperlipidemia, unspecified: Secondary | ICD-10-CM | POA: Diagnosis not present

## 2015-03-02 DIAGNOSIS — M2550 Pain in unspecified joint: Secondary | ICD-10-CM | POA: Diagnosis not present

## 2015-03-02 DIAGNOSIS — F17200 Nicotine dependence, unspecified, uncomplicated: Secondary | ICD-10-CM | POA: Insufficient documentation

## 2015-03-02 DIAGNOSIS — K6289 Other specified diseases of anus and rectum: Secondary | ICD-10-CM

## 2015-03-02 DIAGNOSIS — Z7185 Encounter for immunization safety counseling: Secondary | ICD-10-CM

## 2015-03-02 DIAGNOSIS — M1000 Idiopathic gout, unspecified site: Secondary | ICD-10-CM | POA: Diagnosis not present

## 2015-03-02 DIAGNOSIS — I10 Essential (primary) hypertension: Secondary | ICD-10-CM

## 2015-03-02 DIAGNOSIS — Z91199 Patient's noncompliance with other medical treatment and regimen due to unspecified reason: Secondary | ICD-10-CM

## 2015-03-02 DIAGNOSIS — R9431 Abnormal electrocardiogram [ECG] [EKG]: Secondary | ICD-10-CM

## 2015-03-02 DIAGNOSIS — Z7189 Other specified counseling: Secondary | ICD-10-CM

## 2015-03-02 DIAGNOSIS — E669 Obesity, unspecified: Secondary | ICD-10-CM

## 2015-03-02 DIAGNOSIS — Z0000 Encounter for general adult medical examination without abnormal findings: Secondary | ICD-10-CM

## 2015-03-02 LAB — COMPREHENSIVE METABOLIC PANEL
ALBUMIN: 4.5 g/dL (ref 3.6–5.1)
ALK PHOS: 43 U/L (ref 40–115)
ALT: 23 U/L (ref 9–46)
AST: 19 U/L (ref 10–35)
BUN: 19 mg/dL (ref 7–25)
CHLORIDE: 106 mmol/L (ref 98–110)
CO2: 25 mmol/L (ref 20–31)
CREATININE: 1.13 mg/dL (ref 0.70–1.33)
Calcium: 9.1 mg/dL (ref 8.6–10.3)
Glucose, Bld: 86 mg/dL (ref 65–99)
Potassium: 4.2 mmol/L (ref 3.5–5.3)
SODIUM: 141 mmol/L (ref 135–146)
TOTAL PROTEIN: 6.8 g/dL (ref 6.1–8.1)
Total Bilirubin: 0.5 mg/dL (ref 0.2–1.2)

## 2015-03-02 LAB — POCT URINALYSIS DIPSTICK
BILIRUBIN UA: NEGATIVE
Glucose, UA: NEGATIVE
KETONES UA: NEGATIVE
LEUKOCYTES UA: NEGATIVE
Nitrite, UA: NEGATIVE
PH UA: 6
PROTEIN UA: NEGATIVE
RBC UA: NEGATIVE
SPEC GRAV UA: 1.02
Urobilinogen, UA: NEGATIVE

## 2015-03-02 LAB — LIPID PANEL
CHOLESTEROL: 214 mg/dL — AB (ref 125–200)
HDL: 48 mg/dL (ref >=40)
LDL Cholesterol: 141 mg/dL — ABNORMAL HIGH (ref <130)
TRIGLYCERIDES: 124 mg/dL (ref <150)
Total CHOL/HDL Ratio: 4.5 Ratio (ref <=5.0)
VLDL: 25 mg/dL (ref <30)

## 2015-03-02 LAB — CBC
HEMATOCRIT: 43.2 % (ref 39.0–52.0)
HEMOGLOBIN: 14.8 g/dL (ref 13.0–17.0)
MCH: 31 pg (ref 26.0–34.0)
MCHC: 34.3 g/dL (ref 30.0–36.0)
MCV: 90.6 fL (ref 78.0–100.0)
MPV: 10.3 fL (ref 8.6–12.4)
Platelets: 296 10*3/uL (ref 150–400)
RBC: 4.77 MIL/uL (ref 4.22–5.81)
RDW: 14.2 % (ref 11.5–15.5)
WBC: 7.4 10*3/uL (ref 4.0–10.5)

## 2015-03-02 LAB — HEMOGLOBIN A1C
Hgb A1c MFr Bld: 5.8 % — ABNORMAL HIGH (ref <5.7)
MEAN PLASMA GLUCOSE: 120 mg/dL — AB (ref <117)

## 2015-03-02 LAB — URIC ACID: URIC ACID, SERUM: 8.2 mg/dL — AB (ref 4.0–7.8)

## 2015-03-02 MED ORDER — AMOXICILLIN-POT CLAVULANATE 875-125 MG PO TABS: 1.0000 | ORAL_TABLET | Freq: Two times a day (BID) | ORAL | Status: DC

## 2015-03-02 MED ORDER — LISINOPRIL 20 MG PO TABS: 20.0000 mg | ORAL_TABLET | Freq: Every day | ORAL | Status: DC

## 2015-03-02 MED ORDER — COLCHICINE 0.6 MG PO TABS: 0.6000 mg | ORAL_TABLET | Freq: Two times a day (BID) | ORAL | Status: DC

## 2015-03-02 MED ORDER — ASPIRIN 81 MG PO TBEC: DELAYED_RELEASE_TABLET | ORAL | Status: DC

## 2015-03-02 MED ORDER — ATORVASTATIN CALCIUM 20 MG PO TABS: 20.0000 mg | ORAL_TABLET | Freq: Every day | ORAL | Status: DC

## 2015-03-02 MED ORDER — INDOMETHACIN 50 MG PO CAPS: 50.0000 mg | ORAL_CAPSULE | Freq: Three times a day (TID) | ORAL | Status: DC

## 2015-03-02 MED ORDER — TRIAMCINOLONE ACETONIDE 0.1 % EX CREA: 1.0000 "application " | TOPICAL_CREAM | Freq: Two times a day (BID) | CUTANEOUS | Status: DC

## 2015-03-02 MED ORDER — ALLOPURINOL 300 MG PO TABS: 300.0000 mg | ORAL_TABLET | Freq: Every day | ORAL | Status: DC

## 2015-03-02 NOTE — Progress Notes (Signed)
Subjective:   HPI  Troy Stevens is a 54 y.o. male who presents for a complete physical.  Has hx/o Hypertension , gout, hyperlipidemia, obesity, but due to losing insurance, hasn't been back in 2 years, and has been out of medication for a while now, months.    Has been having some muscle cramps  Thinks he may have hemorrhoids. Feels something full at rectum, and having some seepage or either blood or mucous for a few days, but not sure.  Not paretically painful.  Saw dermatology 2 years ago, wants refill on cream he uses sometimes for eczema flares  Reviewed their medical, surgical, family, social, medication, and allergy history and updated chart as appropriate.  Past Medical History  Diagnosis Date  . Hyperlipidemia   . Hypertension   . Allergy   . Arthritis   . Atopic dermatitis   . Amputation of thumb     left, s/p conveyer belt injury  . 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 Gout   . Substance abuse     marijuania weekly    Past Surgical History  Procedure Laterality Date  . Knee surgery  2001    left, arthroscopic  . Forearm surgery      left trauma repair and skin graft s/p conveyer belt injury  . Skin graft      removed from left thigh, transplanted to left forearm  . Mouth surgery    . Thumb amputation    . Tendon exploration Right 01/16/2013    Procedure: TENDON EXPLORATION AND REPAIRS RIGHT RING AND SMALL FINGERS ;  Surgeon: Wynonia Sours, MD;  Location: [facility];  Service: Orthopedics;  Laterality: Right;  . Colonoscopy  01/31/2012    Sigmoid Diverticulosis, otherwise normal. Repeat 2023, Dr. Silvano Rusk    Family History  Problem Relation Age of Onset  . Other Mother     died in [place]  . Other Father     died in [place]  . Arthritis Brother   . Hypertension Brother   . Hypertension Sister   . Hyperlipidemia Paternal Aunt   . Stroke Paternal Grandfather   . Colon cancer Neg Hx   . Stomach cancer Neg Hx   . Diabetes Maternal Grandfather   .  Heart disease Sister   . Cancer Sister     cancer  . Gout Brother   . Hypertension Brother   . Heart disease Brother   . Kidney disease Brother     Social History   Social History  . Marital Status: Married    Spouse Name: N/A  . Number of Children: N/A  . Years of Education: N/A   Occupational History  . laid off     use to work at [employer] Topics  . Smoking status: Light Tobacco Smoker -- 0.25 packs/day for 20 years    Types: Cigarettes, Cigars    Start date: 05/12/2013  . Smokeless tobacco: Never Used  . Alcohol Use: 0.6 oz/week    1 Cans of beer per week     Comment: 1-2 beers a week  . Drug Use: Yes    Special: Marijuana     Comment: once a week last time 01/15/13  . Sexual Activity: Not on file   Other Topics Concern  . Not on file   Social History Narrative   Married, 2 children, exercise - none other than.   Working at [employer], pulls orders for paint, and does  deliveries.  Walks on the job.            No current outpatient prescriptions on file prior to visit.   No current facility-administered medications on file prior to visit.    No Known Allergies    Review of Systems Constitutional: -fever, -chills, -sweats, -unexpected weight change, -anorexia, -fatigue Allergy: -sneezing, -itching, -congestion Dermatology: denies changing moles, rash, lumps, new worrisome lesions ENT: -runny nose, -ear pain, -sore throat, -hoarseness, -sinus pain, -teeth pain, -tinnitus, -hearing loss, -epistaxis Cardiology:  -chest pain, -palpitations, -edema, -orthopnea, -paroxysmal nocturnal dyspnea Respiratory: -cough, -shortness of breath, -dyspnea on exertion, -wheezing, -hemoptysis Gastroenterology: -abdominal pain, -nausea, -vomiting, -diarrhea, -constipation, -blood in stool, -changes in bowel movement, -dysphagia Hematology: -bleeding or bruising problems Musculoskeletal: +arthralgias, +myalgias,-joint swelling, -back pain, -neck pain,  +cramping, -gait changes Ophthalmology: -vision changes, -eye redness, -itching, -discharge Urology: -dysuria, -difficulty urinating, -hematuria, -urinary frequency, -urgency, incontinence Neurology: -headache, -weakness, -tingling, -numbness, -speech abnormality, -memory loss, -falls, -dizziness Psychology:  -depressed mood, -agitation, -sleep problems   Objective:   Physical Exam  BP 140/90 mmHg  Pulse 60  Temp(Src) 97.9 F (36.6 C) (Oral)  Resp 18  Ht 5\' 6"  (1.676 m)  Wt 230 lb 12.8 oz (104.69 kg)  BMI 37.27 kg/m2  General appearance: alert, no distress, WD/WN, AA male, obese Skin:left upper earlobe with 65mm round hard yellowish lesion, few other benign appearing macules, left lateral forearm throughout from wrist to just proximal to elbow region with large obvious deformity of skin from prior trauma, has scar from prior skin grafting, left upper thigh with rectangular skin scar from prior skin graft, nodules under skin with raised skin above left knee, left olecranon HEENT: normocephalic, conjunctiva/corneas normal, sclerae anicteric, PERRLA, EOMi, nares patent, no discharge or erythema, pharynx normal Oral cavity: MMM, tongue normal, teeth in good repair Neck: supple, no lymphadenopathy, no thyromegaly, no masses, normal ROM, no bruits Chest: non tender, normal shape and expansion Heart: RRR, normal S1, S2, no murmurs Lungs: CTA bilaterally, no wheezes, rhonchi, or rales Abdomen: +bs, soft, non tender, non distended, no masses, no hepatomegaly, no splenomegaly, no bruits Back: non tender, normal ROM, no scoliosis Musculoskeletal: left hand s/p thumb amputation and scarring from prior skin grafting left lateral forearm, increased abduction of left index finger, otherwise no obvious swelling, upper extremities non tender, no obvious deformity, normal ROM throughout, lower extremities non tender, no obvious deformity, normal ROM throughout, left knee port surgical scars Extremities:  no edema, no cyanosis, no clubbing Pulses: 2+ symmetric, upper and lower extremities, normal cap refill Neurological: alert, oriented x 3, CN2-12 intact, strength normal upper extremities and lower extremities, sensation normal throughout, DTRs 2+ throughout, no cerebellar signs, gait normal Psychiatric: normal affect, behavior normal, pleasant  GU: normal male external genitalia, circumcised, nontender, no masses, no hernia, no lymphadenopathy Rectal: anus normal, +left buttock approx 4 cm from anus with pustule, 107mm diameter with subcutaneous 2 cm roundish nodule or cyst, nontender, otherwise prostate seems mildly enlarge, no nodules, occult negative stool   Adult ECG Report  Indication: hypertension, physical  Rate: 51 bpm  Rhythm: sinus bradycardia  QRS Axis: 37 degrees  PR Interval: 160 ms  QRS Duration: 172ms  QTc: 390 ms  Conduction Disturbances: fusion complexes  Other Abnormalities: strain pattern V2, V3  Patient's cardiac risk factors are: dyslipidemia, hypertension, male gender, obesity (BMI >= 30 kg/m2) and smoking/ tobacco exposure.  EKG comparison: 01/2013  Narrative Interpretation: 01/2013.  Sinus bradycardia is not new, but the strain pattern is  new    Assessment and Plan :    Encounter Diagnoses  Name Primary?  . Encounter for health maintenance examination in adult Yes  . Essential hypertension   . Hyperlipidemia   . Obesity   . Idiopathic gout, unspecified chronicity, unspecified site   . Other male erectile dysfunction   . Nonspecific abnormal electrocardiogram (ECG) (EKG)   . Noncompliance   . Smoker   . Vaccine counseling   . Polyarthralgia   . Perirectal discomfort   . Need for prophylactic vaccination and inoculation against influenza   . Need for prophylactic vaccination against Streptococcus pneumoniae (pneumococcus)   . Eczema     Physical exam - discussed healthy lifestyle, diet, exercise, preventative care, vaccinations, and addressed their  concerns.    See your eye doctor yearly for routine vision care.  See your dentist yearly for routine dental care including hygiene visits twice yearly.  HTN - discused importance of compliance, risks of uncontrolled hypertnesion.  Restart medication  Hyperlipidemia and xanthelasma  - discussed diagnosis, treatment, and restart medication  Obesity - discussed need for lifestyle changes, weight loss  Gout - discussed possibly restarting medication.  F/u pending labs  Abnormal EKG - in light of risk factors, refer to cardiology  Smoker - discused risks, advised cessation.  He is not ready to quit  Counseled on the influenza virus vaccine.  Vaccine information sheet given.  Influenza vaccine given after consent obtained.  Counseled on the pneumococcal vaccine.  Vaccine information sheet given.  Pneumococcal vaccine PPSV 23 given after consent obtained.  Perirectal discomfort - possible cyst, no obvious fistula or abscess.  Begin Augmentin, SITZ baths, f/u pending labs  F/u pending labs

## 2015-03-02 NOTE — Addendum Note (Signed)
Addended by: Carlena Hurl on: 03/02/2015 12:01 PM   Modules accepted: Orders

## 2015-03-03 LAB — MICROALBUMIN / CREATININE URINE RATIO
CREATININE, URINE: 112.4 mg/dL
MICROALB UR: 0.2 mg/dL (ref <2.0)
MICROALB/CREAT RATIO: 1.8 mg/g (ref 0.0–30.0)

## 2015-03-03 LAB — PSA: PSA: 0.83 ng/mL (ref <=4.00)

## 2015-03-10 MED ORDER — COLCHICINE 0.6 MG PO TABS: 0.6000 mg | ORAL_TABLET | Freq: Two times a day (BID) | ORAL | Status: DC

## 2015-03-10 NOTE — Addendum Note (Signed)
Addended by: Minette Headland A on: 03/10/2015 04:56 PM   Modules accepted: Orders

## 2015-04-04 NOTE — Progress Notes (Signed)
**Note Troy-Identified via Obfuscation** Cardiology Office Note   Date:  04/06/2015   ID:  Troy Stevens, DOB [birth year], MRN [id number]  PCP:  Troy Oxford, PA-C  Cardiologist:   Troy Harness, MD   Chief Complaint  Patient presents with  . New Evaluation    referred for abnormal ekg by Troy Stevens. patient reports no complaints.      History of Present Illness: Troy Stevens is a 54 y.o. male with hypertension, hyperlipidemia, and obesity who presents for evaluation of an abnormal EKG.  Troy Stevens saw Troy Bode, PA-C, on 03/02/15 for a physical exam.  At that appointment an EKG was obtained that showed sinus bradycardia with a fusion complex and a PAC.  There was concern for a strain pattern, so he was referred to cardiology for further evaluation.    Troy Stevens has been doing well.  His only complaint is occasional abdominal cramping, diaphoresis and nausea.  He notes that this occurs when he is constipated but has been better since he had a bowel movement. He denies chest pain, shortness of breath, palpitations, lightheadedness, dizziness, orthopnea or PND. He does not get much exercise but is physically active at work. He has no limitations with these activities. He does have occasional swelling in his joints, but this is what he has a gout flare.  He reports that his diet is poor. He is with his wife who reports that she packs his lunch daily. However he often does not eat what she packs and it stops for fast food and stent. He drinks mostly water and Gatorade during the day at work.  He occasionally smokes of black and mild and marijuana.   Past Medical History  Diagnosis Date  . Hyperlipidemia   . Hypertension   . Allergy   . Arthritis   . Atopic dermatitis   . Amputation of thumb     left, s/p conveyer belt injury  . 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 Gout   . Substance abuse     marijuania weekly    Past Surgical History  Procedure Laterality Date  . Knee surgery  2001    left,  arthroscopic  . Forearm surgery      left trauma repair and skin graft s/p conveyer belt injury  . Skin graft      removed from left thigh, transplanted to left forearm  . Mouth surgery    . Thumb amputation    . Tendon exploration Right 01/16/2013    Procedure: TENDON EXPLORATION AND REPAIRS RIGHT RING AND SMALL FINGERS ;  Surgeon: Troy Sours, MD;  Location: [facility];  Service: Orthopedics;  Laterality: Right;  . Colonoscopy  01/31/2012    Sigmoid Diverticulosis, otherwise normal. Repeat 2023, Troy Stevens     Current Outpatient Prescriptions  Medication Sig Dispense Refill  . allopurinol (ZYLOPRIM) 300 MG tablet Take 1 tablet (300 mg total) by mouth daily. 90 tablet 3  . amoxicillin-clavulanate (AUGMENTIN) 875-125 MG per tablet Take 1 tablet by mouth 2 (two) times daily. 20 tablet 0  . aspirin (ASPIRIN LOW DOSE) 81 MG EC tablet TAKE 1 TABLET BY MOUTH DAILY, SWALLOW WHOLE 90 tablet 3  . atorvastatin (LIPITOR) 20 MG tablet Take 1 tablet (20 mg total) by mouth daily. 90 tablet 3  . colchicine 0.6 MG tablet Take 1 tablet (0.6 mg total) by mouth 2 (two) times daily. 60 tablet 1  . indomethacin (INDOCIN) 50 MG capsule Take 1 capsule (50 mg total)  by mouth 3 (three) times daily with meals. 60 capsule 1  . lisinopril (PRINIVIL,ZESTRIL) 20 MG tablet Take 1 tablet (20 mg total) by mouth daily. 90 tablet 3  . triamcinolone cream (KENALOG) 0.1 % Apply 1 application topically 2 (two) times daily. 80 g 1   No current facility-administered medications for this visit.    Allergies:   Review of patient's allergies indicates no known allergies.    Social History:  The patient  reports that he has been smoking Cigarettes and Cigars.  He started smoking about 22 months ago. He has a 5 pack-year smoking history. He has never used smokeless tobacco. He reports that he drinks about 0.6 oz of alcohol per week. He reports that he uses illicit drugs (Marijuana).   Family History:  The  patient's family history includes Arthritis in his brother; Cancer in his sister; Diabetes in his maternal grandfather; Gout in his brother; Heart disease in his brother and sister; Hyperlipidemia in his paternal aunt; Hypertension in his brother, brother, and sister; Kidney disease in his brother; Other in his father and mother; Stroke in his paternal grandfather. There is no history of Colon cancer or Stomach cancer.    ROS:  Please see the history of present illness.   Otherwise, review of systems are positive for none.   All other systems are reviewed and negative.    PHYSICAL EXAM: VS:  BP 108/68 mmHg  Pulse 52  Ht 5' 7.5" (1.715 m)  Wt 102.785 kg (226 lb 9.6 oz)  BMI 34.95 kg/m2 , BMI Body mass index is 34.95 kg/(m^2). GENERAL:  Well appearing HEENT:  Pupils equal round and reactive, fundi not visualized, oral mucosa unremarkable NECK:  No jugular venous distention, waveform within normal limits, carotid upstroke brisk and symmetric, no bruits, no thyromegaly LYMPHATICS:  No cervical adenopathy LUNGS:  Clear to auscultation bilaterally HEART:  RRR.  PMI not displaced or sustained,S1 and S2 within normal limits, no S3, no S4, no clicks, no rubs, I/VI systolic murmur at the RUSB. ABD:  Flat, positive bowel sounds normal in frequency in pitch, no bruits, no rebound, no guarding, no midline pulsatile mass, no hepatomegaly, no splenomegaly EXT:  2 plus pulses throughout, no edema, no cyanosis no clubbing SKIN:  No rashes no nodules NEURO:  Cranial nerves II through XII grossly intact, motor grossly intact throughout PSYCH:  Cognitively intact, oriented to person place and time    EKG:  EKG is ordered today. The ekg ordered today demonstrates sinus arrhythmia and bradycardia at 52 bpm.    Recent Labs: 03/02/2015: ALT 23; BUN 19; Creat 1.13; Hemoglobin 14.8; Platelets 296; Potassium 4.2; Sodium 141    Lipid Panel    Component Value Date/Time   CHOL 214* 03/02/2015 0001   TRIG 124  03/02/2015 0001   HDL 48 03/02/2015 0001   CHOLHDL 4.5 03/02/2015 0001   VLDL 25 03/02/2015 0001   LDLCALC 141* 03/02/2015 0001      Wt Readings from Last 3 Encounters:  04/06/15 102.785 kg (226 lb 9.6 oz)  03/02/15 104.69 kg (230 lb 12.8 oz)  05/29/13 107.502 kg (237 lb)      ASSESSMENT AND PLAN:  # Abnormal EKG: Mr. Glasscock EKG showed sinus bradycardia with PACs and one fusion beat. This beat was of abnormally wide morphology and was thought to represent a strain pattern. Other than the sinus arrhythmia and bradycardia, his EKG is unremarkable. He does not have any signs of ischemia on EKG nor does he report  any symptoms concerning for ischemia. We will not do any further ischemia dilation at this time.  # Hyperlipidemia: Mr. Betker started on atorvastatin one month ago due to his ASCVD 10 year risk of 13.3%.  We'll repeat his lipids today to ensure that he is at goal.  # Tobacco abuse: We advised him to stop smoking. He expresses understanding and thinks he can do this without any pharmacologic intervention.   #Hypertension: Blood pressure well controlled today. Continue lisinopril.  # Obesity: We instructed Mr. Gault that he should be getting 30-40 minutes of aerobic exercise daily. If he is able to do this his lipids will likely improve and if he also stops smoking he is likely to be able to stop his Lipitor. He expressed understanding and will work on increasing his physical activity.      Current medicines are reviewed at length with the patient today.  The patient does not have concerns regarding medicines.  The following changes have been made:  no change  Labs/ tests ordered today include:  No orders of the defined types were placed in this encounter.     Disposition:   FU with Augusten Lipkin C. Oval Linsey, MD in 1 year.    Signed, Troy Harness, MD  04/06/2015 8:15 AM    [place]

## 2015-04-06 ENCOUNTER — Other Ambulatory Visit: Payer: Self-pay | Admitting: Cardiovascular Disease

## 2015-04-06 ENCOUNTER — Ambulatory Visit (INDEPENDENT_AMBULATORY_CARE_PROVIDER_SITE_OTHER): Payer: 59 | Admitting: Cardiovascular Disease

## 2015-04-06 ENCOUNTER — Encounter: Payer: Self-pay | Admitting: Cardiovascular Disease

## 2015-04-06 VITALS — BP 108/68 | HR 52 | Ht 67.5 in | Wt 226.6 lb

## 2015-04-06 DIAGNOSIS — R001 Bradycardia, unspecified: Secondary | ICD-10-CM

## 2015-04-06 DIAGNOSIS — E785 Hyperlipidemia, unspecified: Secondary | ICD-10-CM | POA: Diagnosis not present

## 2015-04-06 LAB — LIPID PANEL
CHOL/HDL RATIO: 3.4 ratio (ref <=5.0)
Cholesterol: 143 mg/dL (ref 125–200)
HDL: 42 mg/dL (ref >=40)
LDL CALC: 85 mg/dL (ref <130)
Triglycerides: 78 mg/dL (ref <150)
VLDL: 16 mg/dL (ref <30)

## 2015-04-06 NOTE — Patient Instructions (Signed)
Your physician recommends that you return for lab work at your earliest [facility].  Dr Oval Linsey recommends that you schedule a follow-up appointment in 1 year. You will receive a reminder letter in the mail two months in advance. If you don't receive a letter, please call our office to schedule the follow-up appointment.

## 2015-04-08 ENCOUNTER — Telehealth: Payer: Self-pay | Admitting: *Deleted

## 2015-04-08 NOTE — Telephone Encounter (Signed)
Spoke to patient. Result given . Verbalized understanding  

## 2015-04-08 NOTE — Telephone Encounter (Signed)
-----   Message from Skeet Latch, MD sent at 04/07/2015 10:35 PM EDT ----- Cholesterol looks good.  Continue current dose of atorvastatin.

## 2015-08-10 ENCOUNTER — Encounter: Payer: Self-pay | Admitting: Medical

## 2015-08-10 ENCOUNTER — Ambulatory Visit (INDEPENDENT_AMBULATORY_CARE_PROVIDER_SITE_OTHER): Payer: 59 | Admitting: Medical

## 2015-08-10 VITALS — BP 150/90 | HR 67 | Temp 99.0°F | Wt 217.0 lb

## 2015-08-10 DIAGNOSIS — J029 Acute pharyngitis, unspecified: Secondary | ICD-10-CM | POA: Diagnosis not present

## 2015-08-10 DIAGNOSIS — J039 Acute tonsillitis, unspecified: Secondary | ICD-10-CM | POA: Diagnosis not present

## 2015-08-10 LAB — POCT RAPID STREP A (OFFICE): RAPID STREP A SCREEN: NEGATIVE

## 2015-08-10 MED ORDER — AMOXICILLIN 875 MG PO TABS: 875.0000 mg | ORAL_TABLET | Freq: Two times a day (BID) | ORAL | Status: DC

## 2015-08-10 NOTE — Addendum Note (Signed)
Addended by: Billie Lade on: 08/10/2015 11:17 AM   Modules accepted: Orders, SmartSet

## 2015-08-10 NOTE — Patient Instructions (Signed)
Encounter Diagnoses  Name Primary?  . Sore throat Yes  . Acute tonsillitis, unspecified etiology     Recommendations:  Begin Amoxicillin for infection  Use salt water gargles 3-4 times daily  Use warm fluids such as coffee or hot tea to sooth pain  Use Ibuprofen or other anti-inflammatory to help with pain  If worse pain, swelling or fever, then return  Follow up in 1 week to re-examine the throat   Peritonsillar Abscess A peritonsillar abscess is a collection of yellowish-white fluid (pus) in the back of the throat behind the tonsils. It usually occurs when an infection of the throat or tonsils (tonsillitis) spreads into the tissues around the tonsils. CAUSES The infection that leads to a peritonsillar abscess is usually caused by streptococcal bacteria.  SIGNS AND SYMPTOMS  Sore throat, often with pain on just one side.  Swelling and tenderness of the glands (lymph nodes) in the neck.  Difficulty swallowing.  Difficulty opening your mouth.  Fever.  Chills.  Drooling because of difficulty swallowing saliva.  Headache.  Changes in your voice.  Bad breath. DIAGNOSIS Your health care provider will take your medical history and do a physical exam. Imaging tests may be done, such as an ultrasound or CT scan. A sample of pus may be removed from the abscess using a needle (needle aspiration) or by swabbing the back of your throat. This sample will be sent to a lab for testing. TREATMENT Treatment usually involves draining the pus from the abscess. This may be done through needle aspiration or by making an incision in the abscess. You will also likely need to take antibiotic medicine. HOME CARE INSTRUCTIONS  Rest as much as possible and get plenty of sleep.  Take medicines only as directed by your health care provider.  If you were prescribed an antibiotic medicine, finish it all even if you start to feel better.  If your abscess was drained by your health care  provider, gargle with a mixture of salt and warm water:  Mix 1 tsp of salt in 8 oz of warm water.  Gargle with this mixture four times per day or as needed for comfort.  Do not swallow this mixture.  Drink plenty of fluids.  While your throat is sore, eat soft or liquid foods, such as frozen ice pops and ice cream.  Keep all follow-up visits as directed by your health care provider. This is important. SEEK MEDICAL CARE IF:  You have increased pain, swelling, redness, or drainage in your throat.  You develop a headache, a lack of energy (lethargy), or generalized feelings of illness.  You have a fever.  You feel dizzy.  You have difficulty swallowing or eating.  You show signs of becoming dehydrated, such as:  Light-headedness when standing.  Decreased urine output.  A fast heart rate.  Dry mouth. SEEK IMMEDIATE MEDICAL CARE IF:   You have difficulty talking or breathing, or you find it easier to breathe when you lean forward.  You are coughing up blood or vomiting blood.  You have severe throat pain that is not helped by medicines.  You start to drool.   This information is not intended to replace advice given to you by your health care provider. Make sure you discuss any questions you have with your health care provider.   Document Released: 05/30/2005 Document Revised: 06/20/2014 Document Reviewed: 01/13/2014 Elsevier Interactive Patient Education [employer].

## 2015-08-10 NOTE — Progress Notes (Signed)
Subjective: Chief Complaint  Patient presents with  . Sore Throat    started friday. can hardley talk. said that he feels like something is hung in his throart.    Troy Stevens is a 55 y.o. male who presents for evaluation of sore throat x 3.5 days.   Having bad sore throat, left neck hurts, left ear hurts, has had low grade fever.  Has had some chills and body aches. No headache.  Has had some nausea, but no vomiting.  No loose stool.   No sick contacts.   No SOB or wheezing.  Is a smoker.  No hx/o peritonsillar abscess.  No other aggravating or relieving factors.  No other c/o.  The following portions of the patient's history were reviewed and updated as appropriate: allergies, current medications, past medical history, past social history, past surgical history and problem list.  Past Medical History  Diagnosis Date  . Hyperlipidemia   . Hypertension   . Allergy   . Arthritis   . Atopic dermatitis   . Amputation of thumb     left, s/p conveyer belt injury  . 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 Gout   . Substance abuse     marijuania weekly    ROS as in subjective    Objective: BP 150/90 mmHg  Pulse 67  Temp(Src) 99 F (37.2 C) (Tympanic)  Wt 217 lb (98.431 kg)  General appearance: no distress, WD/WN, somewhat ill-appearing HEENT: normocephalic, conjunctiva/corneas normal, sclerae anicteric, nares patent, no discharge or erythema, pharynx with erythema, slight deviation of left tongue upwards but no obvious abscess of tonsillar region, tonsils are swollen and red, no exudate.  Oral cavity: MMM, no lesions  Neck: supple, tender shoddy left anterior nodes, no thyromegaly Heart: RRR, normal S1, S2, no murmurs Lungs: CTA bilaterally, no wheezes, rhonchi, or rales  Laboratory Strep test done. Results:negative.    Assessment: Encounter Diagnoses  Name Primary?  . Sore throat Yes  . Acute tonsillitis, unspecified etiology      Plan: Begin amoxicillin, discussed  supportive care.   discussed possible complications, f/u prn if worse, otherwise recheck 1wk.

## 2015-08-11 NOTE — Addendum Note (Signed)
Addended by: Billie Lade on: 08/11/2015 11:10 AM   Modules accepted: Miquel Dunn

## 2015-10-26 ENCOUNTER — Other Ambulatory Visit: Payer: Self-pay | Admitting: Medical

## 2015-10-26 NOTE — Telephone Encounter (Signed)
Refill and get him in for routine med check soon

## 2015-10-26 NOTE — Telephone Encounter (Signed)
Is this ok to refill?  

## 2016-03-28 ENCOUNTER — Encounter: Payer: 59 | Admitting: Medical

## 2016-05-02 ENCOUNTER — Encounter: Payer: 59 | Admitting: Medical

## 2016-05-12 ENCOUNTER — Encounter: Payer: Self-pay | Admitting: Medical

## 2016-05-12 ENCOUNTER — Ambulatory Visit (INDEPENDENT_AMBULATORY_CARE_PROVIDER_SITE_OTHER): Payer: 59 | Admitting: Medical

## 2016-05-12 VITALS — BP 134/88 | HR 71 | Ht 68.0 in | Wt 223.0 lb

## 2016-05-12 DIAGNOSIS — H0260 Xanthelasma of unspecified eye, unspecified eyelid: Secondary | ICD-10-CM | POA: Diagnosis not present

## 2016-05-12 DIAGNOSIS — Z6833 Body mass index (BMI) 33.0-33.9, adult: Secondary | ICD-10-CM

## 2016-05-12 DIAGNOSIS — I10 Essential (primary) hypertension: Secondary | ICD-10-CM

## 2016-05-12 DIAGNOSIS — Z0000 Encounter for general adult medical examination without abnormal findings: Secondary | ICD-10-CM | POA: Diagnosis not present

## 2016-05-12 DIAGNOSIS — F17200 Nicotine dependence, unspecified, uncomplicated: Secondary | ICD-10-CM

## 2016-05-12 DIAGNOSIS — H579 Unspecified disorder of eye and adnexa: Secondary | ICD-10-CM | POA: Diagnosis not present

## 2016-05-12 DIAGNOSIS — N528 Other male erectile dysfunction: Secondary | ICD-10-CM | POA: Diagnosis not present

## 2016-05-12 DIAGNOSIS — M1000 Idiopathic gout, unspecified site: Secondary | ICD-10-CM | POA: Diagnosis not present

## 2016-05-12 DIAGNOSIS — Z7189 Other specified counseling: Secondary | ICD-10-CM

## 2016-05-12 DIAGNOSIS — Z23 Encounter for immunization: Secondary | ICD-10-CM | POA: Insufficient documentation

## 2016-05-12 DIAGNOSIS — E785 Hyperlipidemia, unspecified: Secondary | ICD-10-CM | POA: Diagnosis not present

## 2016-05-12 DIAGNOSIS — E669 Obesity, unspecified: Secondary | ICD-10-CM

## 2016-05-12 DIAGNOSIS — Z7185 Encounter for immunization safety counseling: Secondary | ICD-10-CM

## 2016-05-12 LAB — LIPID PANEL
CHOL/HDL RATIO: 4.7 ratio (ref <5.0)
CHOLESTEROL: 198 mg/dL (ref <200)
HDL: 42 mg/dL (ref >40)
LDL Cholesterol: 135 mg/dL — ABNORMAL HIGH (ref <100)
TRIGLYCERIDES: 105 mg/dL (ref <150)
VLDL: 21 mg/dL (ref <30)

## 2016-05-12 LAB — POCT URINALYSIS DIPSTICK
Bilirubin, UA: NEGATIVE
Blood, UA: NEGATIVE
Glucose, UA: NEGATIVE
KETONES UA: NEGATIVE
Leukocytes, UA: NEGATIVE
Nitrite, UA: NEGATIVE
PH UA: 6
PROTEIN UA: NEGATIVE
SPEC GRAV UA: 1.025
Urobilinogen, UA: NEGATIVE

## 2016-05-12 LAB — URIC ACID: URIC ACID, SERUM: 11.5 mg/dL — AB (ref 4.0–8.0)

## 2016-05-12 LAB — CBC
HCT: 46.2 % (ref 38.5–50.0)
Hemoglobin: 15.6 g/dL (ref 13.2–17.1)
MCH: 31 pg (ref 27.0–33.0)
MCHC: 33.8 g/dL (ref 32.0–36.0)
MCV: 91.7 fL (ref 80.0–100.0)
MPV: 11 fL (ref 7.5–12.5)
PLATELETS: 269 10*3/uL (ref 140–400)
RBC: 5.04 MIL/uL (ref 4.20–5.80)
RDW: 14.3 % (ref 11.0–15.0)
WBC: 8.4 10*3/uL (ref 4.0–10.5)

## 2016-05-12 LAB — COMPREHENSIVE METABOLIC PANEL
ALBUMIN: 4.6 g/dL (ref 3.6–5.1)
ALT: 23 U/L (ref 9–46)
AST: 20 U/L (ref 10–35)
Alkaline Phosphatase: 44 U/L (ref 40–115)
BUN: 21 mg/dL (ref 7–25)
CHLORIDE: 105 mmol/L (ref 98–110)
CO2: 27 mmol/L (ref 20–31)
Calcium: 9.6 mg/dL (ref 8.6–10.3)
Creat: 1.53 mg/dL — ABNORMAL HIGH (ref 0.70–1.33)
Glucose, Bld: 99 mg/dL (ref 65–99)
POTASSIUM: 4.4 mmol/L (ref 3.5–5.3)
Sodium: 139 mmol/L (ref 135–146)
TOTAL PROTEIN: 7.4 g/dL (ref 6.1–8.1)
Total Bilirubin: 0.6 mg/dL (ref 0.2–1.2)

## 2016-05-12 MED ORDER — INDOMETHACIN 50 MG PO CAPS: ORAL_CAPSULE | ORAL | 1 refills | Status: DC

## 2016-05-12 MED ORDER — COLCHICINE 0.6 MG PO TABS: 0.6000 mg | ORAL_TABLET | Freq: Two times a day (BID) | ORAL | 1 refills | Status: DC

## 2016-05-12 MED ORDER — ASPIRIN 81 MG PO TBEC: DELAYED_RELEASE_TABLET | ORAL | 3 refills | Status: DC

## 2016-05-12 MED ORDER — TADALAFIL 20 MG PO TABS: 10.0000 mg | ORAL_TABLET | ORAL | 2 refills | Status: DC | PRN

## 2016-05-12 MED ORDER — ATORVASTATIN CALCIUM 20 MG PO TABS: 20.0000 mg | ORAL_TABLET | Freq: Every day | ORAL | 3 refills | Status: DC

## 2016-05-12 MED ORDER — AMLODIPINE BESYLATE 10 MG PO TABS: 10.0000 mg | ORAL_TABLET | Freq: Every day | ORAL | 1 refills | Status: DC

## 2016-05-12 NOTE — Patient Instructions (Signed)
Recommendations:  QUIT SMOKING!  Eat a healthy low fat diet.  Limit fast food, fried foods, sweets.  Eat fruits, vegetables, whole grains like whole grain past, oatmeal, brown rice.  Exercise every day, 30 + minutes of walking or other exercise  Call you insurer about coverage for the shingles vaccine  We will call with lab results  Make appointment with an eye doctor   See dentist yearly     Talk to your medical provider about using medicines to help you quit. These include nicotine replacement gum, lozenges, or skin patches.  Consider calling 1-800-QUIT-NOW, a toll free 24/7 hotline with free counseling to help you quit.  If you are ready to quit smoking or are thinking about it, congratulations! You have chosen to help yourself be healthier and live longer! There are lots of different ways to quit smoking. Nicotine gum, nicotine patches, a nicotine inhaler, or nicotine nasal spray can help with physical craving. Hypnosis, support groups, and medicines help break the habit of smoking. TIPS TO GET OFF AND STAY OFF CIGARETTES  Learn to predict your moods. Do not let a bad situation be your excuse to have a cigarette. Some situations in your life might tempt you to have a cigarette.   Ask friends and co-workers not to smoke around you.   Make your home smoke-free.   Never have "just one" cigarette. It leads to wanting another and another. Remind yourself of your decision to quit.   On a card, make a list of your reasons for not smoking. Read it at least the same number of times a day as you have a cigarette. Tell yourself everyday, "I do not want to smoke. I choose not to smoke."   Ask someone at home or work to help you with your plan to quit smoking.   Have something planned after you eat or have a cup of coffee. Take a walk or get other exercise to perk you up. This will help to keep you from overeating.   Try a relaxation exercise to calm you down and decrease your stress.  Remember, you may be tense and nervous the first two weeks after you quit. This will pass.   Find new activities to keep your hands busy. Play with a pen, coin, or rubber band. Doodle or draw things on paper.   Brush your teeth right after eating. This will help cut down the craving for the taste of tobacco after meals. You can try mouthwash too.   Try gum, breath mints, or diet candy to keep something in your mouth.  IF YOU SMOKE AND WANT TO QUIT:  Do not stock up on cigarettes. Never buy a carton. Wait until one pack is finished before you buy another.   Never carry cigarettes with you at work or at home.   Keep cigarettes as far away from you as possible. Leave them with someone else.   Never carry matches or a lighter with you.   Ask yourself, "Do I need this cigarette or is this just a reflex?"   Bet with someone that you can quit. Put cigarette money in a piggy bank every morning. If you smoke, you give up the money. If you do not smoke, by the end of the week, you keep the money.   Keep trying. It takes 21 days to change a habit!  Document Released: 03/26/2009 Document Revised: 02/09/2011 Document Reviewed: 03/26/2009 [facility] Patient Information 2012 [place].

## 2016-05-12 NOTE — Progress Notes (Signed)
Subjective:   HPI  Troy Stevens is a 55 y.o. male who presents for a complete physical.  Accompanied by wife.   He only came in because she made him.  He c/o having trouble getting and keeping erections.  If he misses a dose of Lisinopril, actually gets an erection.  He did ok on prior trial of Viagra but it was too expensive.  He thinks its medication related.  He has some occasional abdominal cramps, but no problems with constipation, diarrhea, gas or bloating.  No other aggravating or relieving factors. No other complaint.  Reviewed their medical, surgical, family, social, medication, and allergy history and updated chart as appropriate.  Past Medical History:  Diagnosis Date  . Allergy   . Amputation of thumb    left, s/p conveyer belt injury  . Arthritis   . Atopic dermatitis   . Gout   . Hyperlipidemia   . Hypertension   . Substance abuse    marijuania weekly  . 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 COLONOSCOPY  01/31/2012   Sigmoid Diverticulosis, otherwise normal. Repeat 2023, Dr. Silvano Rusk  . FOREARM SURGERY     left trauma repair and skin graft s/p conveyer belt injury  . KNEE SURGERY  2001   left, arthroscopic  . MOUTH SURGERY    . 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 TENDON EXPLORATION Right 01/16/2013   Procedure: TENDON EXPLORATION AND REPAIRS RIGHT RING AND SMALL FINGERS ;  Surgeon: Wynonia Sours, MD;  Location: [facility];  Service: Orthopedics;  Laterality: Right;  . THUMB AMPUTATION      Family History  Problem Relation Age of Onset  . Other Mother     died in [place]  . Other Father     died in [place]  . Arthritis Brother   . Hypertension Brother   . Hypertension Sister   . Hyperlipidemia Paternal Aunt   . Stroke Paternal Grandfather   . Colon cancer Neg Hx   . Stomach cancer Neg Hx   . Diabetes Maternal Grandfather   . Heart disease Sister   . Cancer  Sister     cancer  . Gout Brother   . Hypertension Brother   . Heart disease Brother   . Kidney disease Brother     Social History   Social History  . Marital status: Married    Spouse name: N/A  . Number of children: N/A  . Years of education: N/A   Occupational History  . laid off     use to work at [employer]  . Smoking status: Light Tobacco Smoker    Packs/day: 0.25    Years: 20.00    Types: Cigarettes, Cigars    Start date: 05/12/2013  . Smokeless tobacco: Never Used  . Alcohol use 0.6 oz/week    1 Cans of beer per week     Comment: 1-2 beers a week  . Drug use:     Types: Marijuana     Comment: once a week last time 01/15/13  . Sexual activity: Not on file   Other Topics Concern  . Not on file   Social History Narrative   Married, 2 children, exercise - none other than.   Working at [employer], pulls orders for paint, and does deliveries.  Walks on the job.  Epworth Sleepiness Scale = 3 (as of 04/06/2015)          Current Outpatient Prescriptions on File Prior to Visit  Medication Sig Dispense Refill  . allopurinol (ZYLOPRIM) 300 MG tablet Take 1 tablet (300 mg total) by mouth daily. 90 tablet 3   No current facility-administered medications on file prior to visit.     No Known Allergies   Review of Systems Constitutional: -fever, -chills, -sweats, -unexpected weight change, -anorexia, -fatigue Allergy: -sneezing, -itching, -congestion Dermatology: denies changing moles, rash, lumps, new worrisome lesions ENT: -runny nose, -ear pain, -sore throat, -hoarseness, -sinus pain, -teeth pain, -tinnitus, -hearing loss, -epistaxis Cardiology:  -chest pain, -palpitations, -edema, -orthopnea, -paroxysmal nocturnal dyspnea Respiratory: -cough, -shortness of breath, -dyspnea on exertion, -wheezing, -hemoptysis Gastroenterology: -abdominal pain, -nausea, -vomiting, -diarrhea, -constipation, -blood in stool, -changes in bowel  movement, -dysphagia Hematology: -bleeding or bruising problems Musculoskeletal: -arthralgias, -myalgias,-joint swelling, -back pain, -neck pain, -cramping, -gait changes Ophthalmology: -vision changes, -eye redness, -itching, -discharge Urology: -dysuria, -difficulty urinating, -hematuria, -urinary frequency, -urgency, incontinence Neurology: -headache, -weakness, -tingling, -numbness, -speech abnormality, -memory loss, -falls, -dizziness Psychology:  -depressed mood, -agitation, -sleep problems   Objective:   Physical Exam  BP 134/88   Pulse 71   Ht 5\' 8"  (1.727 m)   Wt 223 lb (101.2 kg)   SpO2 97%   BMI 33.91 kg/m   Wt Readings from Last 3 Encounters:  05/12/16 223 lb (101.2 kg)  08/10/15 217 lb (98.4 kg)  04/06/15 226 lb 9.6 oz (102.8 kg)    General appearance: alert, no distress, WD/WN, AA male, obese Skin:left upper eyelid with few small slightly raised yellowish lesions c/w xanthelasma, few other benign appearing macules, left lateral forearm throughout from wrist to just proximal to elbow region with large obvious deformity of skin from prior trauma, muscle loss from prior trauma of left forearm, has scar from prior skin grafting, left upper thigh with rectangular skin scar from prior skin graft, nodules under skin with raised skin above left knee, left and right olecranon HEENT: normocephalic, conjunctiva/corneas normal, sclerae anicteric, PERRLA, EOMi, nares patent, no discharge or erythema, pharynx normal Oral cavity: MMM, tongue normal, teeth in good repair Neck: supple, no lymphadenopathy, no thyromegaly, no masses, normal ROM, no bruits Chest: non tender, normal shape and expansion Heart: RRR, normal S1, S2, no murmurs Lungs: CTA bilaterally, no wheezes, rhonchi, or rales Abdomen: +bs, soft, non tender, non distended, no masses, no hepatomegaly, no splenomegaly, no bruits Back: non tender, normal ROM, no scoliosis Musculoskeletal: left hand s/p thumb amputation and  scarring from prior skin grafting left lateral forearm, increased abduction of left index finger, otherwise no obvious swelling, upper extremities non tender, no obvious deformity, normal ROM throughout, lower extremities non tender, no obvious deformity, normal ROM throughout, left knee port surgical scars Extremities: no edema, no cyanosis, no clubbing Pulses: 2+ symmetric, upper and lower extremities, normal cap refill Neurological: alert, oriented x 3, CN2-12 intact, strength normal upper extremities and lower extremities, sensation normal throughout, DTRs 2+ throughout, no cerebellar signs, gait normal Psychiatric: normal affect, behavior normal, pleasant  GU/rectal - deferred today   Assessment and Plan :    Encounter Diagnoses  Name Primary?  . Encounter for health maintenance examination in adult Yes  . Essential hypertension   . Hyperlipidemia, unspecified hyperlipidemia type   . Needs flu shot   . Other male erectile dysfunction   . Idiopathic gout, unspecified chronicity, unspecified site   . Vaccine counseling   . Smoker   .  Class 1 obesity with serious comorbidity and body mass index (BMI) of 33.0 to 33.9 in adult, unspecified obesity type   . Abnormal vision screen   . Xanthelasma    Physical exam - discussed healthy lifestyle, diet, exercise, preventative care, vaccinations, and addressed their concerns.    See your eye doctor yearly for routine vision care.  See your dentist yearly for routine dental care including hygiene visits twice yearly.  HTN - change to amlodipine for a month, stop lisinopril to see if this works better in regards to erections while still controlling BP  Hyperlipidemia and xanthelasma  - c/t statin  Obesity - discussed need for lifestyle changes, weight loss.  He is not exercising or using diet discretion.   Gout - c/t allopurinol daily, uses colchicine and indocin prn  Smoker - discussed risks, advised cessation.  He will  consider  Erectile Dysfunction - Reviewed pathophysiology and differential diagnosis of erectile dysfunction with the patient.  Discussed treatment options.  Begin trial of Cialis sample.  Discussed potential risks of medications including hypotension and priapism.  Discussed proper use of medication.  Questions were answered.  Recheck by phone in a week.  Advised eye doctor consult yearly but also due to abnormal vision exam  F/u pending labs  Lenton was seen today for physical.  Diagnoses and all orders for this visit:  Encounter for health maintenance examination in adult -     CBC -     Comprehensive metabolic panel -     Lipid panel -     Microalbumin / creatinine urine ratio -     Hemoglobin A1c -     Uric acid -     Urinalysis Dipstick  Essential hypertension -     Comprehensive metabolic panel  Hyperlipidemia, unspecified hyperlipidemia type -     Comprehensive metabolic panel -     Lipid panel -     Microalbumin / creatinine urine ratio -     Hemoglobin A1c  Needs flu shot -     Flu Vaccine QUAD 36+ mos IM  Other male erectile dysfunction  Idiopathic gout, unspecified chronicity, unspecified site -     Uric acid  Vaccine counseling  Smoker  Class 1 obesity with serious comorbidity and body mass index (BMI) of 33.0 to 33.9 in adult, unspecified obesity type -     Hemoglobin A1c  Abnormal vision screen  Xanthelasma  Other orders -     amLODipine (NORVASC) 10 MG tablet; Take 1 tablet (10 mg total) by mouth daily. -     indomethacin (INDOCIN) 50 MG capsule; TAKE ONE CAPSULE BY MOUTH THREE TIMES DAILY WITH  MEALS for gout flare -     aspirin (ASPIRIN LOW DOSE) 81 MG EC tablet; TAKE 1 TABLET BY MOUTH DAILY, SWALLOW WHOLE -     colchicine 0.6 MG tablet; Take 1 tablet (0.6 mg total) by mouth 2 (two) times daily. For gout flare -     atorvastatin (LIPITOR) 20 MG tablet; Take 1 tablet (20 mg total) by mouth daily.

## 2016-05-13 ENCOUNTER — Other Ambulatory Visit: Payer: Self-pay | Admitting: Medical

## 2016-05-13 LAB — MICROALBUMIN / CREATININE URINE RATIO
Creatinine, Urine: 97 mg/dL (ref 20–370)
Microalb Creat Ratio: 3 mcg/mg creat (ref <30)
Microalb, Ur: 0.3 mg/dL

## 2016-05-13 LAB — HEMOGLOBIN A1C
Hgb A1c MFr Bld: 5.6 % (ref <5.7)
Mean Plasma Glucose: 114 mg/dL

## 2016-05-13 MED ORDER — ALLOPURINOL 300 MG PO TABS: 300.0000 mg | ORAL_TABLET | Freq: Every day | ORAL | 3 refills | Status: DC

## 2016-06-08 ENCOUNTER — Encounter: Payer: Self-pay | Admitting: Medical

## 2016-06-08 ENCOUNTER — Ambulatory Visit (INDEPENDENT_AMBULATORY_CARE_PROVIDER_SITE_OTHER): Payer: 59 | Admitting: Medical

## 2016-06-08 VITALS — BP 142/88 | HR 79 | Wt 233.0 lb

## 2016-06-08 DIAGNOSIS — E785 Hyperlipidemia, unspecified: Secondary | ICD-10-CM

## 2016-06-08 DIAGNOSIS — N528 Other male erectile dysfunction: Secondary | ICD-10-CM | POA: Diagnosis not present

## 2016-06-08 DIAGNOSIS — M1000 Idiopathic gout, unspecified site: Secondary | ICD-10-CM | POA: Diagnosis not present

## 2016-06-08 DIAGNOSIS — I10 Essential (primary) hypertension: Secondary | ICD-10-CM | POA: Diagnosis not present

## 2016-06-08 DIAGNOSIS — R7989 Other specified abnormal findings of blood chemistry: Secondary | ICD-10-CM | POA: Diagnosis not present

## 2016-06-08 MED ORDER — LISINOPRIL 20 MG PO TABS: 20.0000 mg | ORAL_TABLET | Freq: Every day | ORAL | 3 refills | Status: DC

## 2016-06-08 NOTE — Patient Instructions (Signed)
Recommendations:  Troy Stevens out the Amlodipine blood pressure pill, then switch back to Lisinopril that you were on prior for blood pressure  Continue all other medications the same  Call insurance to see what their preferred medication is for erectile dysfunction.  Let me know so we can try and use the cheapest alternative for erections  Cialis  Viagra  Stender  Stayxen  Levitra  Plan to come in for lab visit 2-3 weeks after you start back on Lisinopril

## 2016-06-08 NOTE — Progress Notes (Signed)
Subjective Chief Complaint  Patient presents with  . follow up  from lab    follow up from lab , swelling in ankle from b/p meds    Here with wife.   Last visit 05/12/16 we had him restart statin, allopurinol, and he is doing fine on these medications without c/o.  No recent out issues, no leg aches.    Last visit we switched from lisinopril to amlodipine due to fears that lisinopril was causing ED, but he notes no change in ED.  However, he is having ankle swelling since beginning amlodipine.  This is bothering him  He notes Cialis worked fine but each tablet was $20.    He has no new c/o otherwise   Past Medical History:  Diagnosis Date  . Allergy   . Amputation of thumb    left, s/p conveyer belt injury  . Arthritis   . Atopic dermatitis   . Gout   . Hyperlipidemia   . Hypertension   . Substance abuse    marijuania weekly  . 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 allopurinol (ZYLOPRIM) 300 MG tablet Take 1 tablet (300 mg total) by mouth daily. 90 tablet 3  . aspirin (ASPIRIN LOW DOSE) 81 MG EC tablet TAKE 1 TABLET BY MOUTH DAILY, SWALLOW WHOLE 90 tablet 3  . atorvastatin (LIPITOR) 20 MG tablet Take 1 tablet (20 mg total) by mouth daily. 90 tablet 3  . colchicine 0.6 MG tablet Take 1 tablet (0.6 mg total) by mouth 2 (two) times daily. For gout flare 60 tablet 1  . indomethacin (INDOCIN) 50 MG capsule TAKE ONE CAPSULE BY MOUTH THREE TIMES DAILY WITH  MEALS for gout flare 60 capsule 1  . tadalafil (CIALIS) 20 MG tablet Take 0.5-1 tablets (10-20 mg total) by mouth every other day as needed for erectile dysfunction. 5 tablet 2   No current facility-administered medications on file prior to visit.    ROS as in subjective   Objective: BP (!) 142/88   Pulse 79   Wt 233 lb (105.7 kg)   SpO2 96%   BMI 35.43 kg/m   BP Readings from Last 3 Encounters:  06/08/16 (!) 142/88  05/12/16 134/88   08/10/15 (!) 150/90   Wt Readings from Last 3 Encounters:  06/08/16 233 lb (105.7 kg)  05/12/16 223 lb (101.2 kg)  08/10/15 217 lb (98.4 kg)   General appearance: alert, no distress, WD/WN Oral cavity: MMM, no lesions Neck: supple, no lymphadenopathy, no thyromegaly, no masses, no bruits Heart: RRR, normal S1, S2, no murmurs Lungs: CTA bilaterally, no wheezes, rhonchi, or rales Abdomen: +bs, soft, non tender, non distended, no masses, no hepatomegaly, no splenomegaly, no bruits Pulses: 2+ symmetric, upper and lower extremities, normal cap refill    Assessment: Encounter Diagnoses  Name Primary?  . Essential hypertension Yes  . Other male erectile dysfunction   . Hyperlipidemia, unspecified hyperlipidemia type   . Idiopathic gout, unspecified chronicity, unspecified site   . Elevated serum creatinine     Plan: Due to swelling with amlopdine, and no change in ED, switch back to Lisinopril that you were on prior for blood pressure Continue all other medications the same  Call insurance to see what their preferred medication is for erectile dysfunction.  Let me know so we can try and use the cheapest alternative for erections  Cialis  Viagra  Stender  Stayxen  Levitra  Plan to come in for lab visit 2-3 weeks after you start back on Lisinopril  Jayesh was seen today for follow up  from lab.  Diagnoses and all orders for this visit:  Essential hypertension -     Basic metabolic panel -     Uric acid  Other male erectile dysfunction  Hyperlipidemia, unspecified hyperlipidemia type  Idiopathic gout, unspecified chronicity, unspecified site -     Uric acid  Elevated serum creatinine  Other orders -     lisinopril (PRINIVIL,ZESTRIL) 20 MG tablet; Take 1 tablet (20 mg total) by mouth daily.

## 2016-06-20 ENCOUNTER — Ambulatory Visit: Payer: 59 | Admitting: Medical

## 2016-07-13 ENCOUNTER — Telehealth: Payer: Self-pay | Admitting: Medical

## 2016-07-13 NOTE — Telephone Encounter (Signed)
pls mail card or call and let him know thanks for the kind referral Elizbeth Squires)

## 2016-07-28 ENCOUNTER — Telehealth: Payer: Self-pay | Admitting: Medical

## 2016-07-28 ENCOUNTER — Other Ambulatory Visit: Payer: Self-pay

## 2016-07-28 ENCOUNTER — Other Ambulatory Visit: Payer: Self-pay | Admitting: Medical

## 2016-07-28 MED ORDER — ALLOPURINOL 300 MG PO TABS: 300.0000 mg | ORAL_TABLET | Freq: Every day | ORAL | 3 refills | Status: DC

## 2016-07-28 MED ORDER — ALLOPURINOL 300 MG PO TABS
300.0000 mg | ORAL_TABLET | Freq: Every day | ORAL | 3 refills | Status: DC
Start: 2016-07-28 — End: 2016-07-28

## 2016-07-28 MED ORDER — ALLOPURINOL 300 MG PO TABS: 300.0000 mg | ORAL_TABLET | Freq: Every day | ORAL | 2 refills | Status: DC

## 2016-07-28 NOTE — Telephone Encounter (Signed)
Done, and tell him thank you for the kind referral a few weeks ago.    FYI - I had asked Beverlee Nims to send a thank you card for this.

## 2016-07-28 NOTE — Telephone Encounter (Signed)
Requesting refill on Allpurinol 300 mg to NEW PHARMACY at [facility]

## 2016-08-10 ENCOUNTER — Other Ambulatory Visit: Payer: Self-pay | Admitting: Medical

## 2016-08-10 ENCOUNTER — Telehealth: Payer: Self-pay | Admitting: Medical

## 2016-08-10 MED ORDER — INDOMETHACIN 50 MG PO CAPS: ORAL_CAPSULE | ORAL | 1 refills | Status: DC

## 2016-08-10 NOTE — Telephone Encounter (Signed)
Pt wife called and states that pt needs a refill on his Indocin pt uses [facility] [place] - [address] [facility] [address]

## 2016-11-18 ENCOUNTER — Telehealth: Payer: Self-pay | Admitting: Medical

## 2016-11-18 NOTE — Telephone Encounter (Signed)
Wife called pt needs refill triamcinolone cream uses as needed, mainly in summer with heat has flair ups,  Please refill to [facility]

## 2016-11-18 NOTE — Telephone Encounter (Signed)
Called and spoke with patient  To let him know that I will  Have ask Audelia Acton first before call this in to pharmacy, because he hasn't had this in awhile. Audelia Acton can he have a rx for the cream ?

## 2016-11-21 ENCOUNTER — Other Ambulatory Visit: Payer: Self-pay | Admitting: Medical

## 2016-11-21 MED ORDER — TRIAMCINOLONE ACETONIDE 0.1 % EX CREA: 1.0000 "application " | TOPICAL_CREAM | Freq: Two times a day (BID) | CUTANEOUS | 1 refills | Status: DC

## 2016-11-21 NOTE — Telephone Encounter (Signed)
Pt was notified of this 

## 2016-11-21 NOTE — Telephone Encounter (Signed)
Cream sent for eczema for worse flares.  Make sure he is using daily moisturizing lotion

## 2016-11-24 ENCOUNTER — Other Ambulatory Visit: Payer: Self-pay | Admitting: Medical

## 2016-11-24 MED ORDER — TRIAMCINOLONE ACETONIDE 0.1 % EX CREA: 1.0000 "application " | TOPICAL_CREAM | Freq: Two times a day (BID) | CUTANEOUS | 1 refills | Status: DC

## 2017-01-09 ENCOUNTER — Other Ambulatory Visit: Payer: Self-pay | Admitting: Medical

## 2017-01-09 ENCOUNTER — Telehealth: Payer: Self-pay | Admitting: Medical

## 2017-01-09 MED ORDER — COLCHICINE 0.6 MG PO TABS: 0.6000 mg | ORAL_TABLET | Freq: Two times a day (BID) | ORAL | 1 refills | Status: DC

## 2017-01-09 NOTE — Telephone Encounter (Signed)
Wife states pt having gout attack and needs colchicine refill to [facility]

## 2017-01-09 NOTE — Telephone Encounter (Signed)
done

## 2017-01-10 ENCOUNTER — Other Ambulatory Visit: Payer: Self-pay | Admitting: Medical

## 2017-01-10 ENCOUNTER — Telehealth: Payer: Self-pay | Admitting: Internal Medicine

## 2017-01-10 MED ORDER — COLCHICINE 0.6 MG PO TABS: 0.6000 mg | ORAL_TABLET | Freq: Every day | ORAL | 1 refills | Status: DC

## 2017-01-10 NOTE — Telephone Encounter (Signed)
Drug colchicine is not covered by plan. Preferred alternative is colcrys, mitigare. Please send in med

## 2017-01-11 ENCOUNTER — Encounter: Payer: Self-pay | Admitting: Medical

## 2017-01-11 ENCOUNTER — Ambulatory Visit (INDEPENDENT_AMBULATORY_CARE_PROVIDER_SITE_OTHER): Payer: 59 | Admitting: Medical

## 2017-01-11 ENCOUNTER — Other Ambulatory Visit: Payer: Self-pay

## 2017-01-11 VITALS — BP 140/88 | HR 59

## 2017-01-11 DIAGNOSIS — M1000 Idiopathic gout, unspecified site: Secondary | ICD-10-CM

## 2017-01-11 DIAGNOSIS — I10 Essential (primary) hypertension: Secondary | ICD-10-CM | POA: Diagnosis not present

## 2017-01-11 MED ORDER — COLCRYS 0.6 MG PO TABS: 0.6000 mg | ORAL_TABLET | Freq: Every day | ORAL | 1 refills | Status: DC

## 2017-01-11 MED ORDER — INDOMETHACIN 50 MG PO CAPS: ORAL_CAPSULE | ORAL | 0 refills | Status: DC

## 2017-01-11 MED ORDER — HYDROCODONE-ACETAMINOPHEN 7.5-325 MG PO TABS: 1.0000 | ORAL_TABLET | Freq: Four times a day (QID) | ORAL | 0 refills | Status: DC | PRN

## 2017-01-11 MED ORDER — ALLOPURINOL 300 MG PO TABS: 300.0000 mg | ORAL_TABLET | Freq: Every day | ORAL | 3 refills | Status: DC

## 2017-01-11 NOTE — Telephone Encounter (Signed)
[facility] needs col[facility]icine [facility] as DAW- resubmitted to [facility]. Troy Stevens

## 2017-01-11 NOTE — Progress Notes (Signed)
Subjective: Chief Complaint  Patient presents with  . leg pain    leg pain, thinks it gout  started in ankle  pain when to knee   Here for leg pain.   Started with pain in right ankle, then swelling, then pain started in right knee over last several days. Swelling has been on and off.   No injury, no trauma, no fever.  He has had a few flares recently but not frequent flare ups since last visit.   He usually takes Allopurinol 300mg   In general but during flare ups he stops allopurinol.   He was out of Colcyrs til he called in yesterday.  He has started Colcrys but its $135 copay.   He hasn't taken Indocin, has questions about the medications.    HTN - not monitoring BPs.   Is taking BP medication daily.  No other aggravating or relieving factors. No other complaint.  Past Medical History:  Diagnosis Date  . Allergy   . Amputation of thumb    left, s/p conveyer belt injury  . Arthritis   . Atopic dermatitis   . Gout   . Hyperlipidemia   . Hypertension   . Substance abuse    marijuania weekly  . 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 aspirin (ASPIRIN LOW DOSE) 81 MG EC tablet TAKE 1 TABLET BY MOUTH DAILY, SWALLOW WHOLE 90 tablet 3  . atorvastatin (LIPITOR) 20 MG tablet Take 1 tablet (20 mg total) by mouth daily. 90 tablet 3  . colchicine (COLCRYS) 0.6 MG tablet Take 1 tablet (0.6 mg total) by mouth daily. 30 tablet 1  . lisinopril (PRINIVIL,ZESTRIL) 20 MG tablet Take 1 tablet (20 mg total) by mouth daily. 90 tablet 3  . triamcinolone cream (KENALOG) 0.1 % Apply 1 application topically 2 (two) times daily. 80 g 1   No current facility-administered medications on file prior to visit.    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  Otherwise leg nontender, no other swelling or deformity Pulses 1+ LE   Assessment: Encounter Diagnoses  Name Primary?  . Essential hypertension Yes  . Idiopathic gout, unspecified chronicity, unspecified site     Plan: Discussed symptoms, concerns, recommendations as below.  Gout  Take Allopurinol EVERY day for prevention of gout  Begin Norco pain medication, 1 tablet every 6 hours as needed for pain  Take Colcrys twice daily for flare up only  If needed, you can add Indocin 1 tablet up to 3 times daily for 4-5 days at a time to also help with gout flare  In general Colcyrs and Incodin are not preventative medications, so don't take them every day ongoing.  These are only for flare ups.    Try to alternate the medications by a few hours at least  so you aren't taking Norco, Colcrys, Indocin all the exact same time  Drink some tart cherry juice  Rest the leg   If not improved by Friday call back  Check insurance or pharmacy on [place] a newer colchicine brand  Veasna was seen today for leg pain.  Diagnoses and all orders for this visit:  Essential hypertension  Idiopathic gout, unspecified chronicity, unspecified site  Other orders -     HYDROcodone-acetaminophen (NORCO) 7.5-325 MG tablet; Take 1 tablet by mouth every 6 (six) hours as needed for moderate pain. -     indomethacin (INDOCIN) 50 MG capsule; TAKE ONE CAPSULE BY MOUTH THREE TIMES DAILY WITH  MEALS for gout flare -     allopurinol (ZYLOPRIM) 300 MG tablet; Take 1 tablet (300 mg total) by mouth daily.

## 2017-01-11 NOTE — Patient Instructions (Signed)
Gout  Take Allopurinol EVERY day for prevention of gout  Begin Norco pain medication, 1 tablet every 6 hours as needed for pain  Take Colcrys twice daily for flare up only  If needed, you can add Indocin 1 tablet up to 3 times daily for 4-5 days at a time to also help with gout flare  In general Colcyrs and Incodin are not preventative medications, so don't take them every day ongoing.  These are only for flare ups.    Try to alterante the medications by a few hours at least so you aren't taking Norco, Colcrys, Indocine all the exact same time  Drink some tart cherry juice  Rest the leg   If not improved by Friday call back  Check insurance or pharmacy on [place] a newer colchicine brand    Gout Gout is painful swelling that can occur in some of your joints. Gout is a type of arthritis. This condition is caused by having too much uric acid in your body. Uric acid is a chemical that forms when your body breaks down substances called purines. Purines are important for building body proteins. When your body has too much uric acid, sharp crystals can form and build up inside your joints. This causes pain and swelling. Gout attacks can happen quickly and be very painful (acute gout). Over time, the attacks can affect more joints and become more frequent (chronic gout). Gout can also cause uric acid to build up under your skin and inside your kidneys. What are the causes? This condition is caused by too much uric acid in your blood. This can occur because:  Your kidneys do not remove enough uric acid from your blood. This is the most common cause.  Your body makes too much uric acid. This can occur with some cancers and cancer treatments. It can also occur if your body is breaking down too many red blood cells (hemolytic anemia).  You eat too many foods that are high in purines. These foods include organ meats and some seafood. Alcohol, especially beer, is also high in purines.  A gout  attack may be triggered by trauma or stress. What increases the risk? This condition is more likely to develop in people who:  Have a family history of gout.  Are male and middle-aged.  Are male and have gone through menopause.  Are obese.  Frequently drink alcohol, especially beer.  Are dehydrated.  Lose weight too quickly.  Have an organ transplant.  Have lead poisoning.  Take certain medicines, including aspirin, cyclosporine, diuretics, levodopa, and niacin.  Have kidney disease or psoriasis.  What are the signs or symptoms? An attack of acute gout happens quickly. It usually occurs in just one joint. The most common place is the big toe. Attacks often start at night. Other joints that may be affected include joints of the feet, ankle, knee, fingers, wrist, or elbow. Symptoms may include:  Severe pain.  Warmth.  Swelling.  Stiffness.  Tenderness. The affected joint may be very painful to touch.  Shiny, red, or purple skin.  Chills and fever.  Chronic gout may cause symptoms more frequently. More joints may be involved. You may also have white or yellow lumps (tophi) on your hands or feet or in other areas near your joints. How is this diagnosed? This condition is diagnosed based on your symptoms, medical history, and physical exam. You may have tests, such as:  Blood tests to measure uric acid levels.  Removal of  joint fluid with a needle (aspiration) to look for uric acid crystals.  X-rays to look for joint damage.  How is this treated? Treatment for this condition has two phases: treating an acute attack and preventing future attacks. Acute gout treatment may include medicines to reduce pain and swelling, including:  NSAIDs.  Steroids. These are strong anti-inflammatory medicines that can be taken by mouth (orally) or injected into a joint.  Colchicine. This medicine relieves pain and swelling when it is taken soon after an attack. It can be given  orally or through an IV tube.  Preventive treatment may include:  Daily use of smaller doses of NSAIDs or colchicine.  Use of a medicine that reduces uric acid levels in your blood.  Changes to your diet. You may need to see a specialist about healthy eating (dietitian).  Follow these instructions at home: During a Gout Attack  If directed, apply ice to the affected area: ? Put ice in a plastic bag. ? Place a towel between your skin and the bag. ? Leave the ice on for 20 minutes, 2-3 times a day.  Rest the joint as much as possible. If the affected joint is in your leg, you may be given crutches to use.  Raise (elevate) the affected joint above the level of your heart as often as possible.  Drink enough fluids to keep your urine clear or pale yellow.  Take over-the-counter and prescription medicines only as told by your health care provider.  Do not drive or operate heavy machinery while taking prescription pain medicine.  Follow instructions from your health care provider about eating or drinking restrictions.  Return to your normal activities as told by your health care provider. Ask your health care provider what activities are safe for you. Avoiding Future Gout Attacks  Follow a low-purine diet as told by your dietitian or health care provider. Avoid foods and drinks that are high in purines, including liver, kidney, anchovies, asparagus, herring, mushrooms, mussels, and beer.  Limit alcohol intake to no more than 1 drink a day for nonpregnant women and 2 drinks a day for men. One drink equals 12 oz of beer, 5 oz of wine, or 1 oz of hard liquor.  Maintain a healthy weight or lose weight if you are overweight. If you want to lose weight, talk with your health care provider. It is important that you do not lose weight too quickly.  Start or maintain an exercise program as told by your health care provider.  Drink enough fluids to keep your urine clear or pale  yellow.  Take over-the-counter and prescription medicines only as told by your health care provider.  Keep all follow-up visits as told by your health care provider. This is important. Contact a health care provider if:  You have another gout attack.  You continue to have symptoms of a gout attack after10 days of treatment.  You have side effects from your medicines.  You have chills or a fever.  You have burning pain when you urinate.  You have pain in your lower back or belly. Get help right away if:  You have severe or uncontrolled pain.  You cannot urinate. This information is not intended to replace advice given to you by your health care provider. Make sure you discuss any questions you have with your health care provider. Document Released: 05/27/2000 Document Revised: 11/05/2015 Document Reviewed: 03/12/2015 Elsevier Interactive Patient Education  2017 [employer].

## 2017-03-21 ENCOUNTER — Encounter: Payer: Self-pay | Admitting: Medical

## 2017-03-21 ENCOUNTER — Ambulatory Visit
Admission: RE | Admit: 2017-03-21 | Discharge: 2017-03-21 | Disposition: A | Discharge status: Home / Self Care | Payer: 59 | Source: Ambulatory Visit | Attending: Medical | Admitting: Medical

## 2017-03-21 ENCOUNTER — Ambulatory Visit (INDEPENDENT_AMBULATORY_CARE_PROVIDER_SITE_OTHER): Payer: 59 | Admitting: Medical

## 2017-03-21 VITALS — BP 136/82 | HR 70 | Wt 234.2 lb

## 2017-03-21 DIAGNOSIS — Z23 Encounter for immunization: Secondary | ICD-10-CM

## 2017-03-21 DIAGNOSIS — M25561 Pain in right knee: Secondary | ICD-10-CM

## 2017-03-21 DIAGNOSIS — M1000 Idiopathic gout, unspecified site: Secondary | ICD-10-CM | POA: Diagnosis not present

## 2017-03-21 MED ORDER — HYDROCODONE-ACETAMINOPHEN 7.5-325 MG PO TABS: 1.0000 | ORAL_TABLET | Freq: Four times a day (QID) | ORAL | 0 refills | Status: DC | PRN

## 2017-03-21 NOTE — Progress Notes (Signed)
Subjective: Chief Complaint  Patient presents with  . Knee Pain    both knee pain , some swelling  x3 days    Here for pains in right knee x 3 days, worse on medial side.   No swelling.   This pain feels different than gout.   Left knee been hurting a little.    No recent strenuous activity, no fall, no injury.   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     Gout - compliant with allopurinol, doing fine in this regard.  Here for flu shot  Past Medical History:  Diagnosis Date  . Allergy   . Amputation of thumb    left, s/p conveyer belt injury  . Arthritis   . Atopic dermatitis   . Gout   . Hyperlipidemia   . Hypertension   . Substance abuse ([place])    marijuania weekly  . 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 allopurinol (ZYLOPRIM) 300 MG tablet Take 1 tablet (300 mg total) by mouth daily. 90 tablet 3  . aspirin (ASPIRIN LOW DOSE) 81 MG EC tablet TAKE 1 TABLET BY MOUTH DAILY, SWALLOW WHOLE 90 tablet 3  . atorvastatin (LIPITOR) 20 MG tablet Take 1 tablet (20 mg total) by mouth daily. 90 tablet 3  . COLCRYS 0.6 MG tablet Take 1 tablet (0.6 mg total) by mouth daily. 30 tablet 1  . indomethacin (INDOCIN) 50 MG capsule TAKE ONE CAPSULE BY MOUTH THREE TIMES DAILY WITH  MEALS for gout flare 60 capsule 0  . lisinopril (PRINIVIL,ZESTRIL) 20 MG tablet Take 1 tablet (20 mg total) by mouth daily. 90 tablet 3  . triamcinolone cream (KENALOG) 0.1 % Apply 1 application topically 2 (two) times daily. 80 g 1   No current facility-administered medications on file prior to visit.    ROS as in subjective   Objective: BP 136/82   Pulse 70   Wt 234 lb 3.2 oz (106.2 kg)   SpO2 98%   BMI 35.61 kg/m   Gen: wd, wn, nad Skin: no erythema, no warmth, no swelling, no bruising MSK: tender over right MCL, tender along medial joint line,  there is bony arthritic changes of both distal medial femurs condyles, otherwise no swelling, no other deformity, normal knee hip and ankle ROM Rest of legs nontender No edema Legs neurovascularly intact     Assessment: Encounter Diagnoses  Name Primary?  . Acute pain of right knee Yes  . Need for influenza vaccination   . Idiopathic gout, unspecified chronicity, unspecified site     Plan: Knee pain, likely MCL strain with underlying OA pain - send for xray.   Advised few days of rest, ice 20 min BID, discussed diagnosis, treatment recommendations, avoiding re injury.  Advised daily stretching routine  Counseled on the influenza virus vaccine.  Vaccine information sheet given.  Influenza vaccine given after consent obtained.  Gout - c/t allopurinol, return in november for lab recheck and physical   Sandeep was seen today for knee pain.  Diagnoses and all orders for this visit:  Acute pain of right knee -     DG Knee Complete 4 Views Right; Future  Need for influenza vaccination -     Flu Vaccine QUAD 36+ mos IM  Idiopathic gout, unspecified chronicity, unspecified site  Other orders -  HYDROcodone-acetaminophen (NORCO) 7.5-325 MG tablet; Take 1 tablet by mouth every 6 (six) hours as needed for moderate pain.

## 2017-06-05 ENCOUNTER — Telehealth: Payer: Self-pay | Admitting: Medical

## 2017-06-05 MED ORDER — ATORVASTATIN CALCIUM 20 MG PO TABS: 20.0000 mg | ORAL_TABLET | Freq: Every day | ORAL | 0 refills | Status: DC

## 2017-06-05 NOTE — Telephone Encounter (Signed)
Rcvd refill request for Atorvastatin 20 mg #30 from [facility]  @ [place]

## 2017-06-05 NOTE — Telephone Encounter (Signed)
rx sent 30 days.

## 2017-06-14 ENCOUNTER — Encounter: Payer: 59 | Admitting: Medical

## 2017-07-12 ENCOUNTER — Ambulatory Visit (INDEPENDENT_AMBULATORY_CARE_PROVIDER_SITE_OTHER): Payer: 59 | Admitting: Medical

## 2017-07-12 ENCOUNTER — Encounter: Payer: Self-pay | Admitting: Medical

## 2017-07-12 VITALS — BP 136/80 | HR 65 | Wt 239.6 lb

## 2017-07-12 DIAGNOSIS — Z7189 Other specified counseling: Secondary | ICD-10-CM | POA: Diagnosis not present

## 2017-07-12 DIAGNOSIS — Z0000 Encounter for general adult medical examination without abnormal findings: Secondary | ICD-10-CM | POA: Diagnosis not present

## 2017-07-12 DIAGNOSIS — I10 Essential (primary) hypertension: Secondary | ICD-10-CM

## 2017-07-12 DIAGNOSIS — R7989 Other specified abnormal findings of blood chemistry: Secondary | ICD-10-CM

## 2017-07-12 DIAGNOSIS — Z1211 Encounter for screening for malignant neoplasm of colon: Secondary | ICD-10-CM

## 2017-07-12 DIAGNOSIS — M2550 Pain in unspecified joint: Secondary | ICD-10-CM

## 2017-07-12 DIAGNOSIS — H0260 Xanthelasma of unspecified eye, unspecified eyelid: Secondary | ICD-10-CM

## 2017-07-12 DIAGNOSIS — Z7185 Encounter for immunization safety counseling: Secondary | ICD-10-CM

## 2017-07-12 DIAGNOSIS — N528 Other male erectile dysfunction: Secondary | ICD-10-CM | POA: Diagnosis not present

## 2017-07-12 DIAGNOSIS — Z1159 Encounter for screening for other viral diseases: Secondary | ICD-10-CM | POA: Insufficient documentation

## 2017-07-12 DIAGNOSIS — F17200 Nicotine dependence, unspecified, uncomplicated: Secondary | ICD-10-CM

## 2017-07-12 DIAGNOSIS — Z125 Encounter for screening for malignant neoplasm of prostate: Secondary | ICD-10-CM | POA: Diagnosis not present

## 2017-07-12 DIAGNOSIS — Z131 Encounter for screening for diabetes mellitus: Secondary | ICD-10-CM | POA: Insufficient documentation

## 2017-07-12 DIAGNOSIS — M1000 Idiopathic gout, unspecified site: Secondary | ICD-10-CM

## 2017-07-12 DIAGNOSIS — E669 Obesity, unspecified: Secondary | ICD-10-CM

## 2017-07-12 DIAGNOSIS — E785 Hyperlipidemia, unspecified: Secondary | ICD-10-CM

## 2017-07-12 DIAGNOSIS — Z6833 Body mass index (BMI) 33.0-33.9, adult: Secondary | ICD-10-CM

## 2017-07-12 DIAGNOSIS — L309 Dermatitis, unspecified: Secondary | ICD-10-CM

## 2017-07-12 LAB — POCT URINALYSIS DIP (PROADVANTAGE DEVICE)
BILIRUBIN UA: NEGATIVE
BILIRUBIN UA: NEGATIVE mg/dL
GLUCOSE UA: NEGATIVE mg/dL
Leukocytes, UA: NEGATIVE
Nitrite, UA: NEGATIVE
Protein Ur, POC: NEGATIVE mg/dL
RBC UA: NEGATIVE
Specific Gravity, Urine: 1.02
Urobilinogen, Ur: NEGATIVE
pH, UA: 6.5 (ref 5.0–8.0)

## 2017-07-12 NOTE — Patient Instructions (Signed)
  Thank you for giving me the opportunity to serve you today.    Your diagnosis today includes: Encounter Diagnoses  Name Primary?  . Routine general medical examination at a health care facility Yes  . Encounter for health maintenance examination in adult   . Hyperlipidemia, unspecified hyperlipidemia type   . Idiopathic gout, unspecified chronicity, unspecified site   . Class 1 obesity with serious comorbidity and body mass index (BMI) of 33.0 to 33.9 in adult, unspecified obesity type   . Polyarthralgia   . Smoker   . Vaccine counseling   . Elevated serum creatinine   . Other male erectile dysfunction   . Xanthelasma   . Essential hypertension   . Eczema, unspecified type   . Screening for prostate cancer   . Screen for colon cancer     Recommendations:  Eat a healthy low fat diet  Exercise most days per week, at least 30 minutes or more  Work on losing 20 lb in the next 3 months!  Set some weight loss or fitness goals for yourself.   See your eye doctor yearly for routine vision care.  See your dentist yearly for routine dental care including hygiene visits twice yearly.  I recommend you have a Shingles Vaccine to help prevent shingles or herpes zoster outbreak.   Please call your insurer to inquire about coverage for the Shingrix vaccine given in 2 doses.   Some insurers cover this vaccine after age 53, some cover this after age 64.  If your insurer covers this, then call to schedule appointment to have this vaccine here.  I recommend a yearly Influenza/Flu vaccine, typically in September to help reduce the risk of you and others getting the flu illness  We will call with lab results  Return the stool cards for blood check  See me yearly for a physical

## 2017-07-12 NOTE — Progress Notes (Signed)
Subjective:   HPI  Troy Stevens is a 57 y.o. male who presents for a complete physical.  Accompanied by wife.    He reports that he is doing well, no particular issues, no recent gout issues.  Compliant with medication, getting some exercise.  Reviewed their medical, surgical, family, social, medication, and allergy history and updated chart as appropriate.  Past Medical History:  Diagnosis Date  . Allergy   . Amputation of thumb    left, s/p conveyer belt injury  . Arthritis   . Atopic dermatitis   . Gout   . Hyperlipidemia   . Hypertension   . Substance abuse ([place])    marijuania weekly  . 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 COLONOSCOPY  01/31/2012   Sigmoid Diverticulosis, otherwise normal. Repeat 2023, Dr. Silvano Rusk  . FOREARM SURGERY     left trauma repair and skin graft s/p conveyer belt injury  . KNEE SURGERY  2001   left, arthroscopic  . MOUTH SURGERY    . 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 TENDON EXPLORATION Right 01/16/2013   Procedure: TENDON EXPLORATION AND REPAIRS RIGHT RING AND SMALL FINGERS ;  Surgeon: Wynonia Sours, MD;  Location: [place];  Service: Orthopedics;  Laterality: Right;  . THUMB AMPUTATION      Family History  Problem Relation Age of Onset  . Other Mother        died in [place]  . Other Father        died in [place]  . Arthritis Brother   . Hypertension Brother   . Hypertension Sister   . Stroke Paternal Grandfather   . Diabetes Maternal Grandfather   . Heart disease Sister   . Hyperlipidemia Paternal Aunt   . Cancer Sister        cancer  . Gout Brother   . Hypertension Brother   . Heart disease Brother   . Kidney disease Brother   . Colon cancer Neg Hx   . Stomach cancer Neg Hx     Social History   Socioeconomic History  . Marital status: Married    Spouse name: Not on file  . Number of children: Not on file  . Years of  education: Not on file  . Highest education level: Not on file  Social Needs  . Financial resource strain: Not on file  . Food insecurity - worry: Not on file  . Food insecurity - inability: Not on file  . Transportation needs - medical: Not on file  . Transportation needs - non-medical: Not on file  Occupational History  . Occupation: laid off    Comment: use to work at [employer]  . Smoking status: Light Tobacco Smoker    Packs/day: 0.25    Years: 20.00    Pack years: 5.00    Types: Cigarettes, Cigars    Start date: 05/12/2013  . Smokeless tobacco: Never Used  Substance and Sexual Activity  . Alcohol use: Yes    Alcohol/week: 0.6 oz    Types: 1 Cans of beer per week    Comment: 1-2 beers a week  . Drug use: Yes    Types: Marijuana    Comment: once a week last time 01/15/13  . Sexual activity: Not on file  Other Topics Concern  . Not on file  Social History Narrative  Married, 2 children, exercise - none other than.   Working at [employer], pulls orders for paint, and does deliveries.  Walks on the job.        Epworth Sleepiness Scale = 3 (as of 04/06/2015)       Current Outpatient Medications on File Prior to Visit  Medication Sig Dispense Refill  . allopurinol (ZYLOPRIM) 300 MG tablet Take 1 tablet (300 mg total) by mouth daily. 90 tablet 3  . aspirin (ASPIRIN LOW DOSE) 81 MG EC tablet TAKE 1 TABLET BY MOUTH DAILY, SWALLOW WHOLE 90 tablet 3  . atorvastatin (LIPITOR) 20 MG tablet Take 1 tablet (20 mg total) by mouth daily. 30 tablet 0  . COLCRYS 0.6 MG tablet Take 1 tablet (0.6 mg total) by mouth daily. 30 tablet 1  . indomethacin (INDOCIN) 50 MG capsule TAKE ONE CAPSULE BY MOUTH THREE TIMES DAILY WITH  MEALS for gout flare 60 capsule 0  . lisinopril (PRINIVIL,ZESTRIL) 20 MG tablet Take 1 tablet (20 mg total) by mouth daily. 90 tablet 3  . triamcinolone cream (KENALOG) 0.1 % Apply 1 application topically 2 (two) times daily. 80 g 1  .  HYDROcodone-acetaminophen (NORCO) 7.5-325 MG tablet Take 1 tablet by mouth every 6 (six) hours as needed for moderate pain. (Patient not taking: Reported on 07/12/2017) 30 tablet 0   No current facility-administered medications on file prior to visit.     Allergies  Allergen Reactions  . Amlodipine     Ankle edema     Review of Systems Constitutional: -fever, -chills, -sweats, -unexpected weight change, -anorexia, -fatigue Allergy: -sneezing, -itching, -congestion Dermatology: denies changing moles, rash, lumps, new worrisome lesions ENT: -runny nose, -ear pain, -sore throat, -hoarseness, -sinus pain, -teeth pain, -tinnitus, -hearing loss, -epistaxis Cardiology:  -chest pain, -palpitations, -edema, -orthopnea, -paroxysmal nocturnal dyspnea Respiratory: -cough, -shortness of breath, -dyspnea on exertion, -wheezing, -hemoptysis Gastroenterology: -abdominal pain, -nausea, -vomiting, -diarrhea, -constipation, -blood in stool, -changes in bowel movement, -dysphagia Hematology: -bleeding or bruising problems Musculoskeletal: -arthralgias, -myalgias,-joint swelling, -back pain, -neck pain, -cramping, -gait changes Ophthalmology: -vision changes, -eye redness, -itching, -discharge Urology: -dysuria, -difficulty urinating, -hematuria, -urinary frequency, -urgency, incontinence Neurology: -headache, -weakness, -tingling, -numbness, -speech abnormality, -memory loss, -falls, -dizziness Psychology:  -depressed mood, -agitation, -sleep problems    Objective:   Physical Exam  BP 136/80   Pulse 65   Wt 239 lb 9.6 oz (108.7 kg)   SpO2 97%   BMI 36.43 kg/m   Wt Readings from Last 3 Encounters:  07/12/17 239 lb 9.6 oz (108.7 kg)  03/21/17 234 lb 3.2 oz (106.2 kg)  06/08/16 233 lb (105.7 kg)    General appearance: alert, no distress, WD/WN, AA male, obese Skin:left upper eyelid with few small slightly raised yellowish lesions c/w xanthelasma, few other benign appearing macules, left lateral  forearm throughout from wrist to just proximal to elbow region with large obvious deformity of skin from prior trauma, muscle loss from prior trauma of left forearm, has scar from prior skin grafting, left upper thigh with rectangular skin scar from prior skin graft, nodules under skin with raised skin above left knee, left and right olecranon HEENT: normocephalic, conjunctiva/corneas normal, sclerae anicteric, PERRLA, EOMi, nares patent, no discharge or erythema, pharynx normal Oral cavity: MMM, tongue normal, teeth in good repair Neck: supple, no lymphadenopathy, no thyromegaly, no masses, normal ROM, no bruits Chest: non tender, normal shape and expansion Heart: RRR, normal S1, S2, no murmurs Lungs: CTA bilaterally, no wheezes, rhonchi, or rales  Abdomen: +  bs, soft, non tender, non distended, no masses, no hepatomegaly, no splenomegaly, no bruits Back: non tender, normal ROM, no scoliosis Musculoskeletal: left hand s/p thumb amputation and scarring from prior skin grafting left lateral forearm, increased abduction of left index finger, otherwise no obvious swelling, upper extremities non tender, no obvious deformity, normal ROM throughout, lower extremities non tender, no obvious deformity, normal ROM throughout, left knee port surgical scars Extremities: no edema, no cyanosis, no clubbing Pulses: 2+ symmetric, upper and lower extremities, normal cap refill Neurological: alert, oriented x 3, CN2-12 intact, strength normal upper extremities and lower extremities, sensation normal throughout, DTRs 2+ throughout, no cerebellar signs, gait normal Psychiatric: normal affect, behavior normal, pleasant  GU/rectal - anus normal tone, no lesions, prostate mildly enlarged, no nodules Penis and external genitalia normal appearing, circumcised, no mass, lesions or lymphadenopathy , no hernia   Adult ECG Report  Indication: physical  Rate: 57 bpm  Rhythm: sinus bradycardia  QRS Axis: -32 degrees  PR  Interval: 172 ms  QRS Duration: 117ms  QTc: 46ms  Conduction Disturbances: none  Other Abnormalities: none  Patient's cardiac risk factors are: advanced age (older than 47 for men, 71 for women), hypertension, male gender and obesity (BMI >= 30 kg/m2).  EKG comparison: no acute changes from 2016 EKG  Narrative Interpretation: no acute changes, sinus bradycardia    Assessment and Plan :    Encounter Diagnoses  Name Primary?  . Routine general medical examination at a health care facility Yes  . Encounter for health maintenance examination in adult   . Hyperlipidemia, unspecified hyperlipidemia type   . Idiopathic gout, unspecified chronicity, unspecified site   . Class 1 obesity with serious comorbidity and body mass index (BMI) of 33.0 to 33.9 in adult, unspecified obesity type   . Polyarthralgia   . Smoker   . Vaccine counseling   . Elevated serum creatinine   . Other male erectile dysfunction   . Xanthelasma   . Essential hypertension   . Eczema, unspecified type   . Screening for prostate cancer   . Screen for colon cancer    Physical exam - discussed healthy lifestyle, diet, exercise, preventative care, vaccinations, and addressed their concerns.    See your eye doctor yearly for routine vision care.  See your dentist yearly for routine dental care including hygiene visits twice yearly.  HTN - c/t same medication  Hyperlipidemia and xanthelasma  - c/t statin  Obesity - discussed need for lifestyle changes, weight loss.    Gout - c/t allopurinol daily, uses colchicine and indocin prn  Smoker - discussed risks, advised cessation.  He will consider  Recommendations:  Eat a healthy low fat diet  Exercise most days per week, at least 30 minutes or more  Work on losing 20 lb in the next 3 months!  Set some weight loss or fitness goals for yourself.   See your eye doctor yearly for routine vision care.  See your dentist yearly for routine dental care including  hygiene visits twice yearly.  I recommend you have a Shingles Vaccine to help prevent shingles or herpes zoster outbreak.   Please call your insurer to inquire about coverage for the Shingrix vaccine given in 2 doses.   Some insurers cover this vaccine after age 78, some cover this after age 32.  If your insurer covers this, then call to schedule appointment to have this vaccine here.  I recommend a yearly Influenza/Flu vaccine, typically in September to help reduce  the risk of you and others getting the flu illness  We will call with lab results  Return the stool cards for blood check  See me yearly for a physical   F/u pending labs  Cris was seen today for annual exam.  Diagnoses and all orders for this visit:  Routine general medical examination at a health care facility -     POCT Urinalysis DIP (Proadvantage Device) -     Comprehensive metabolic panel -     CBC -     Lipid panel -     PSA -     Microalbumin / creatinine urine ratio -     EKG 12-Lead -     Uric acid  Encounter for health maintenance examination in adult  Hyperlipidemia, unspecified hyperlipidemia type -     Lipid panel -     EKG 12-Lead  Idiopathic gout, unspecified chronicity, unspecified site -     Uric acid  Class 1 obesity with serious comorbidity and body mass index (BMI) of 33.0 to 33.9 in adult, unspecified obesity type  Polyarthralgia  Smoker  Vaccine counseling  Elevated serum creatinine  Other male erectile dysfunction  Xanthelasma  Essential hypertension -     EKG 12-Lead  Eczema, unspecified type  Screening for prostate cancer -     PSA  Screen for colon cancer

## 2017-07-13 ENCOUNTER — Other Ambulatory Visit: Payer: Self-pay | Admitting: Medical

## 2017-07-13 LAB — COMPREHENSIVE METABOLIC PANEL
A/G RATIO: 1.9 (ref 1.2–2.2)
ALBUMIN: 4.5 g/dL (ref 3.5–5.5)
ALK PHOS: 52 IU/L (ref 39–117)
ALT: 27 IU/L (ref 0–44)
AST: 18 IU/L (ref 0–40)
BUN / CREAT RATIO: 11 (ref 9–20)
BUN: 12 mg/dL (ref 6–24)
Bilirubin Total: 0.4 mg/dL (ref 0.0–1.2)
CO2: 25 mmol/L (ref 20–29)
CREATININE: 1.06 mg/dL (ref 0.76–1.27)
Calcium: 9.6 mg/dL (ref 8.7–10.2)
Chloride: 101 mmol/L (ref 96–106)
GFR calc Af Amer: 90 mL/min/{1.73_m2} (ref >59)
GFR calc non Af Amer: 78 mL/min/{1.73_m2} (ref >59)
GLOBULIN, TOTAL: 2.4 g/dL (ref 1.5–4.5)
Glucose: 102 mg/dL — ABNORMAL HIGH (ref 65–99)
Potassium: 4.6 mmol/L (ref 3.5–5.2)
SODIUM: 141 mmol/L (ref 134–144)
Total Protein: 6.9 g/dL (ref 6.0–8.5)

## 2017-07-13 LAB — LIPID PANEL
Chol/HDL Ratio: 3.3 ratio (ref 0.0–5.0)
Cholesterol, Total: 151 mg/dL (ref 100–199)
HDL: 46 mg/dL (ref >39)
LDL Calculated: 82 mg/dL (ref 0–99)
Triglycerides: 113 mg/dL (ref 0–149)
VLDL Cholesterol Cal: 23 mg/dL (ref 5–40)

## 2017-07-13 LAB — MICROALBUMIN / CREATININE URINE RATIO: Creatinine, Urine: 61.9 mg/dL

## 2017-07-13 LAB — PSA: PROSTATE SPECIFIC AG, SERUM: 0.8 ng/mL (ref 0.0–4.0)

## 2017-07-13 LAB — CBC
Hematocrit: 43.2 % (ref 37.5–51.0)
Hemoglobin: 14.5 g/dL (ref 13.0–17.7)
MCH: 30.6 pg (ref 26.6–33.0)
MCHC: 33.6 g/dL (ref 31.5–35.7)
MCV: 91 fL (ref 79–97)
Platelets: 323 10*3/uL (ref 150–379)
RBC: 4.74 x10E6/uL (ref 4.14–5.80)
RDW: 14.2 % (ref 12.3–15.4)
WBC: 6.4 10*3/uL (ref 3.4–10.8)

## 2017-07-13 LAB — URIC ACID: URIC ACID: 4.7 mg/dL (ref 3.7–8.6)

## 2017-07-13 MED ORDER — INDOMETHACIN 50 MG PO CAPS: ORAL_CAPSULE | ORAL | 1 refills | Status: DC

## 2017-07-13 MED ORDER — TRIAMCINOLONE ACETONIDE 0.1 % EX CREA: 1.0000 "application " | TOPICAL_CREAM | Freq: Two times a day (BID) | CUTANEOUS | 1 refills | Status: DC

## 2017-07-13 MED ORDER — ASPIRIN 81 MG PO TBEC: DELAYED_RELEASE_TABLET | ORAL | 3 refills | Status: DC

## 2017-07-13 MED ORDER — LISINOPRIL 20 MG PO TABS: 20.0000 mg | ORAL_TABLET | Freq: Every day | ORAL | 3 refills | Status: DC

## 2017-07-13 MED ORDER — COLCRYS 0.6 MG PO TABS: 0.6000 mg | ORAL_TABLET | Freq: Every day | ORAL | 1 refills | Status: DC

## 2017-07-13 MED ORDER — ATORVASTATIN CALCIUM 20 MG PO TABS: 20.0000 mg | ORAL_TABLET | Freq: Every day | ORAL | 3 refills | Status: DC

## 2017-07-17 ENCOUNTER — Other Ambulatory Visit: Payer: 59

## 2017-07-17 ENCOUNTER — Other Ambulatory Visit: Payer: Self-pay | Admitting: *Deleted

## 2017-07-17 DIAGNOSIS — Z0000 Encounter for general adult medical examination without abnormal findings: Secondary | ICD-10-CM

## 2017-07-17 LAB — POC HEMOCCULT BLD/STL (HOME/3-CARD/SCREEN)
Card #3 Fecal Occult Blood, POC: NEGATIVE
FECAL OCCULT BLD: NEGATIVE
FECAL OCCULT BLD: NEGATIVE

## 2017-10-16 ENCOUNTER — Encounter: Payer: Self-pay | Admitting: Medical

## 2017-10-16 ENCOUNTER — Ambulatory Visit (INDEPENDENT_AMBULATORY_CARE_PROVIDER_SITE_OTHER): Payer: 59 | Admitting: Medical

## 2017-10-16 VITALS — BP 130/88 | HR 75 | Temp 97.8°F | Ht 68.0 in | Wt 236.0 lb

## 2017-10-16 DIAGNOSIS — M65311 Trigger thumb, right thumb: Secondary | ICD-10-CM | POA: Diagnosis not present

## 2017-10-16 NOTE — Progress Notes (Signed)
Subjective: Chief Complaint  Patient presents with  . thumb pain   Here for 2 week hx/o right thumb pain and trigger motion that causes pain.  No injury, no trauma, no change in actively of late.   Using nothing for symptoms.  No numbness, no tingling, no weakness.  No other aggravating or relieving factors. No other complaint.  Past Medical History:  Diagnosis Date  . Allergy   . Amputation of thumb    left, s/p conveyer belt injury  . Arthritis   . Atopic dermatitis   . Gout   . Hyperlipidemia   . Hypertension   . Xanthelasma of eyelid(374.51)    Current Outpatient Medications on File Prior to Visit  Medication Sig Dispense Refill  . allopurinol (ZYLOPRIM) 300 MG tablet Take 1 tablet (300 mg total) by mouth daily. 90 tablet 3  . aspirin (ASPIRIN LOW DOSE) 81 MG EC tablet TAKE 1 TABLET BY MOUTH DAILY, SWALLOW WHOLE 90 tablet 3  . atorvastatin (LIPITOR) 20 MG tablet Take 1 tablet (20 mg total) by mouth daily. 90 tablet 3  . COLCRYS 0.6 MG tablet Take 1 tablet (0.6 mg total) by mouth daily. 30 tablet 1  . indomethacin (INDOCIN) 50 MG capsule TAKE ONE CAPSULE BY MOUTH THREE TIMES DAILY WITH  MEALS for gout flare 60 capsule 1  . lisinopril (PRINIVIL,ZESTRIL) 20 MG tablet Take 1 tablet (20 mg total) by mouth daily. 90 tablet 3  . triamcinolone cream (KENALOG) 0.1 % Apply 1 application topically 2 (two) times daily. 80 g 1   No current facility-administered medications on file prior to visit.    ROS as in subjective   Objective: BP 130/88   Pulse 75   Temp 97.8 F (36.6 C) (Oral)   Ht 5\' 8"  (1.727 m)   Wt 236 lb (107 kg)   SpO2 97%   BMI 35.88 kg/m   Gen: wd, wn ,nad Right thumb tender over MCP and DIP, pain with thumb flexion at DIP, trigger action noted but is able to extend thumb without assistance.   Otherwise hand and fingers non tender right hand Right hand neurovascularly intact    Assessment Encounter Diagnosis  Name Primary?  . Trigger finger of right thumb  Yes    Plan: Discussed symptoms and exam findings.  Can use bag of frozen peas for ice/cool therapy, thumb spica splint OTC, short term indomethacin BID this week, and referral to hand surgeon.   Discussed case with supervising physician Dr. Redmond School.   Gagandeep was seen today for thumb pain.  Diagnoses and all orders for this visit:  Trigger finger of right thumb -     Ambulatory referral to Hand Surgery

## 2017-11-22 DIAGNOSIS — M65311 Trigger thumb, right thumb: Secondary | ICD-10-CM | POA: Diagnosis not present

## 2017-12-27 DIAGNOSIS — M65311 Trigger thumb, right thumb: Secondary | ICD-10-CM | POA: Diagnosis not present

## 2018-01-16 ENCOUNTER — Encounter: Payer: Self-pay | Admitting: Family Medicine

## 2018-01-16 ENCOUNTER — Ambulatory Visit (INDEPENDENT_AMBULATORY_CARE_PROVIDER_SITE_OTHER): Payer: 59 | Admitting: Family Medicine

## 2018-01-16 VITALS — BP 130/88 | HR 65 | Temp 98.0°F | Wt 230.0 lb

## 2018-01-16 DIAGNOSIS — M7751 Other enthesopathy of right foot: Secondary | ICD-10-CM | POA: Diagnosis not present

## 2018-01-16 NOTE — Progress Notes (Signed)
   Subjective:    Patient ID: Troy Stevens, male    DOB: 1960/06/25, 57 y.o.   MRN: [id number]  HPI He complains of a one-week history of right heel pain.  No history of overuse or injury.  He does have gout and continues on allopurinol.  He does have Indocin for when he has a gout flare.  No other joints are involved.   Review of Systems     Objective:   Physical Exam Alert and in no distress.  Exam of the right foot shows good motion with some pain on dorsiflexion in the retrocalcaneal area.  No tenderness over the Achilles tendon.  Pain on palpation of the retrocalcaneal area.  No pain over the plantar surface of the foot       Assessment & Plan:  Retrocalcaneal bursitis (back of heel), right Recommend heat for 20 minutes 3 times per day also take Indocin 3 times daily.  Recommend he also put a slight heel lift in both shoes.  Discussed the use of Cardura for this purpose.  Return here if further trouble.

## 2018-01-16 NOTE — Patient Instructions (Signed)
Take Indocin 3 times per day for the next week.  You can use heat to that area for 20 minutes 3 times per day.  What you might find useful is to put a little heel lift in your shoe.  You can also take Tylenol for pain

## 2018-04-08 ENCOUNTER — Other Ambulatory Visit: Payer: Self-pay | Admitting: Medical

## 2018-04-09 NOTE — Telephone Encounter (Signed)
Is this ok to refill?  

## 2018-05-07 ENCOUNTER — Other Ambulatory Visit (INDEPENDENT_AMBULATORY_CARE_PROVIDER_SITE_OTHER): Payer: 59

## 2018-05-07 DIAGNOSIS — Z23 Encounter for immunization: Secondary | ICD-10-CM | POA: Diagnosis not present

## 2018-07-13 ENCOUNTER — Encounter: Payer: Self-pay | Admitting: Medical

## 2018-07-13 ENCOUNTER — Ambulatory Visit (INDEPENDENT_AMBULATORY_CARE_PROVIDER_SITE_OTHER): Payer: 59 | Admitting: Medical

## 2018-07-13 ENCOUNTER — Other Ambulatory Visit: Payer: Self-pay | Admitting: Medical

## 2018-07-13 VITALS — BP 120/80 | HR 80 | Temp 97.8°F | Resp 16 | Ht 68.0 in | Wt 232.8 lb

## 2018-07-13 DIAGNOSIS — Z7189 Other specified counseling: Secondary | ICD-10-CM

## 2018-07-13 DIAGNOSIS — Z6833 Body mass index (BMI) 33.0-33.9, adult: Secondary | ICD-10-CM

## 2018-07-13 DIAGNOSIS — E785 Hyperlipidemia, unspecified: Secondary | ICD-10-CM | POA: Diagnosis not present

## 2018-07-13 DIAGNOSIS — I10 Essential (primary) hypertension: Secondary | ICD-10-CM

## 2018-07-13 DIAGNOSIS — H0260 Xanthelasma of unspecified eye, unspecified eyelid: Secondary | ICD-10-CM | POA: Diagnosis not present

## 2018-07-13 DIAGNOSIS — Z131 Encounter for screening for diabetes mellitus: Secondary | ICD-10-CM

## 2018-07-13 DIAGNOSIS — N528 Other male erectile dysfunction: Secondary | ICD-10-CM

## 2018-07-13 DIAGNOSIS — Z0000 Encounter for general adult medical examination without abnormal findings: Secondary | ICD-10-CM

## 2018-07-13 DIAGNOSIS — M1000 Idiopathic gout, unspecified site: Secondary | ICD-10-CM

## 2018-07-13 DIAGNOSIS — R252 Cramp and spasm: Secondary | ICD-10-CM

## 2018-07-13 DIAGNOSIS — L309 Dermatitis, unspecified: Secondary | ICD-10-CM

## 2018-07-13 DIAGNOSIS — E669 Obesity, unspecified: Secondary | ICD-10-CM

## 2018-07-13 DIAGNOSIS — M2550 Pain in unspecified joint: Secondary | ICD-10-CM

## 2018-07-13 DIAGNOSIS — F17200 Nicotine dependence, unspecified, uncomplicated: Secondary | ICD-10-CM

## 2018-07-13 DIAGNOSIS — Z7185 Encounter for immunization safety counseling: Secondary | ICD-10-CM

## 2018-07-13 LAB — POCT URINALYSIS DIP (PROADVANTAGE DEVICE)
Bilirubin, UA: NEGATIVE
Blood, UA: NEGATIVE
Glucose, UA: NEGATIVE mg/dL
Ketones, POC UA: NEGATIVE mg/dL
Leukocytes, UA: NEGATIVE
NITRITE UA: NEGATIVE
PROTEIN UA: NEGATIVE mg/dL
Specific Gravity, Urine: 1.025
UUROB: NEGATIVE
pH, UA: 6 (ref 5.0–8.0)

## 2018-07-13 NOTE — Progress Notes (Signed)
Subjective:   HPI  Troy Stevens is a 58 y.o. male who presents for a complete physical.   He reports that he is doing well, no particular issues, no recent gout issues.  Compliant with medication, getting some exercise.  Reviewed their medical, surgical, family, social, medication, and allergy history and updated chart as appropriate.  Past Medical History:  Diagnosis Date  . Allergy   . Amputation of thumb    left, s/p conveyer belt injury  . Arthritis   . Atopic dermatitis   . Diverticulosis    2013 colonoscopy, without diverticulitis  . Gout   . Hyperlipidemia   . Hypertension   . 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 COLONOSCOPY  01/31/2012   Sigmoid Diverticulosis, otherwise normal. Repeat 2023, Dr. Silvano Rusk  . FOREARM SURGERY     left trauma repair and skin graft s/p conveyer belt injury  . KNEE SURGERY  2001   left, arthroscopic  . MOUTH SURGERY    . 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 TENDON EXPLORATION Right 01/16/2013   Procedure: TENDON EXPLORATION AND REPAIRS RIGHT RING AND SMALL FINGERS ;  Surgeon: Wynonia Sours, MD;  Location: [facility];  Service: Orthopedics;  Laterality: Right;  . THUMB AMPUTATION      Family History  Problem Relation Age of Onset  . Other Mother        died in [place]  . Other Father        died in [place]  . Arthritis Brother   . Hypertension Brother   . Hypertension Sister   . Stroke Paternal Grandfather   . Diabetes Maternal Grandfather   . Heart disease Sister   . Hyperlipidemia Paternal Aunt   . Cancer Sister        cancer  . Gout Brother   . Hypertension Brother   . Heart disease Brother   . Kidney disease Brother   . Colon cancer Neg Hx   . Stomach cancer Neg Hx     Social History   Socioeconomic History  . Marital status: Married    Spouse name: Not on file  . Number of children: Not on file  . Years of  education: Not on file  . Highest education level: Not on file  Occupational History  . Occupation: laid off    Comment: use to work at [employer]  . Financial resource strain: Not on file  . Food insecurity:    Worry: Not on file    Inability: Not on file  . Transportation needs:    Medical: Not on file    Non-medical: Not on file  Tobacco Use  . Smoking status: Light Tobacco Smoker    Packs/day: 0.25    Years: 20.00    Pack years: 5.00    Types: Cigarettes, Cigars    Start date: 05/12/2013  . Smokeless tobacco: Never Used  Substance and Sexual Activity  . Alcohol use: Yes    Alcohol/week: 1.0 standard drinks    Types: 1 Cans of beer per week    Comment: 1-2 beers a week  . Drug use: Yes    Types: Marijuana    Comment: once a week last time 01/15/13  . Sexual activity: Not on file  Lifestyle  . Physical activity:    Days per week: Not on file    Minutes  per session: Not on file  . Stress: Not on file  Relationships  . Social connections:    Talks on phone: Not on file    Gets together: Not on file    Attends religious service: Not on file    Active member of club or organization: Not on file    Attends meetings of clubs or organizations: Not on file    Relationship status: Not on file  . Intimate partner violence:    Fear of current or ex partner: Not on file    Emotionally abused: Not on file    Physically abused: Not on file    Forced sexual activity: Not on file  Other Topics Concern  . Not on file  Social History Narrative   Married, 2 children, exercise - 10,000 steps daily at work.   Working at [employer], pulls orders for paint, and does deliveries.  Walks on the job.  06/2018          Current Outpatient Medications on File Prior to Visit  Medication Sig Dispense Refill  . allopurinol (ZYLOPRIM) 300 MG tablet TAKE 1 TABLET(300 MG) BY MOUTH DAILY 90 tablet 0  . aspirin (ASPIRIN LOW DOSE) 81 MG EC tablet TAKE 1 TABLET BY MOUTH DAILY,  SWALLOW WHOLE 90 tablet 3  . atorvastatin (LIPITOR) 20 MG tablet Take 1 tablet (20 mg total) by mouth daily. 90 tablet 3  . COLCRYS 0.6 MG tablet Take 1 tablet (0.6 mg total) by mouth daily. 30 tablet 1  . indomethacin (INDOCIN) 50 MG capsule TAKE ONE CAPSULE BY MOUTH THREE TIMES DAILY WITH  MEALS for gout flare 60 capsule 1  . lisinopril (PRINIVIL,ZESTRIL) 20 MG tablet Take 1 tablet (20 mg total) by mouth daily. 90 tablet 3  . triamcinolone cream (KENALOG) 0.1 % Apply 1 application topically 2 (two) times daily. 80 g 1   No current facility-administered medications on file prior to visit.     Allergies  Allergen Reactions  . Amlodipine     Ankle edema     Review of Systems Constitutional: -fever, -chills, -sweats, -unexpected weight change, -anorexia, -fatigue Allergy: -sneezing, -itching, -congestion Dermatology: denies changing moles, rash, lumps, new worrisome lesions ENT: -runny nose, -ear pain, -sore throat, -hoarseness, -sinus pain, -teeth pain, -tinnitus, -hearing loss, -epistaxis Cardiology:  -chest pain, -palpitations, -edema, -orthopnea, -paroxysmal nocturnal dyspnea Respiratory: -cough, -shortness of breath, -dyspnea on exertion, -wheezing, -hemoptysis Gastroenterology: -abdominal pain, -nausea, -vomiting, -diarrhea, -constipation, -blood in stool, -changes in bowel movement, -dysphagia Hematology: -bleeding or bruising problems Musculoskeletal: -arthralgias, -myalgias,-joint swelling, -back pain, -neck pain, -cramping, -gait changes Ophthalmology: -vision changes, -eye redness, -itching, -discharge Urology: -dysuria, -difficulty urinating, -hematuria, -urinary frequency, -urgency, incontinence Neurology: -headache, -weakness, -tingling, -numbness, -speech abnormality, -memory loss, -falls, -dizziness Psychology:  -depressed mood, -agitation, -sleep problems    Objective:   Physical Exam  BP 120/80   Pulse 80   Temp 97.8 F (36.6 C) (Oral)   Resp 16   Ht 5\' 8"   (1.727 m)   Wt 232 lb 12.8 oz (105.6 kg)   SpO2 97%   BMI 35.40 kg/m   Wt Readings from Last 3 Encounters:  07/13/18 232 lb 12.8 oz (105.6 kg)  01/16/18 230 lb (104.3 kg)  10/16/17 236 lb (107 kg)    General appearance: alert, no distress, WD/WN, AA male, obese Skin:left upper eyelid with few small slightly raised yellowish lesions c/w xanthelasma, few other benign appearing macules, left lateral forearm throughout from wrist to just proximal to elbow  region with large obvious deformity of skin from prior trauma, muscle loss from prior trauma of left forearm, has scar from prior skin grafting, left upper thigh with rectangular skin scar from prior skin graft, nodules under skin with raised skin above left knee, left and right olecranon HEENT: normocephalic, conjunctiva/corneas normal, sclerae anicteric, PERRLA, EOMi, nares patent, no discharge or erythema, pharynx normal Oral cavity: MMM, tongue normal, teeth in good repair Neck: supple, no lymphadenopathy, no thyromegaly, no masses, normal ROM, no bruits Chest: non tender, normal shape and expansion Heart: RRR, normal S1, S2, no murmurs Lungs: CTA bilaterally, no wheezes, rhonchi, or rales  Abdomen: +bs, soft, non tender, non distended, no masses, no hepatomegaly, no splenomegaly, no bruits Back: non tender, normal ROM, no scoliosis Musculoskeletal: left hand s/p thumb amputation and scarring from prior skin grafting left lateral forearm, increased abduction of left index finger, otherwise no obvious swelling, upper extremities non tender, no obvious deformity, normal ROM throughout, lower extremities non tender, no obvious deformity, normal ROM throughout, left knee port surgical scars Extremities: no edema, no cyanosis, no clubbing Pulses: 2+ symmetric, upper and lower extremities, normal cap refill Neurological: alert, oriented x 3, CN2-12 intact, strength normal upper extremities and lower extremities, sensation normal throughout, DTRs  2+ throughout, no cerebellar signs, gait normal Psychiatric: normal affect, behavior normal, pleasant  GU-Penis and external genitalia normal appearing, circumcised, no mass, lesions or lymphadenopathy , no hernia    Assessment and Plan :    Encounter Diagnoses  Name Primary?  . Routine general medical examination at a health care facility Yes  . Essential hypertension   . Eczema, unspecified type   . Xanthelasma   . Other male erectile dysfunction   . Idiopathic gout, unspecified chronicity, unspecified site   . Hyperlipidemia, unspecified hyperlipidemia type   . Class 1 obesity with serious comorbidity and body mass index (BMI) of 33.0 to 33.9 in adult, unspecified obesity type   . Polyarthralgia   . Smoker   . Vaccine counseling   . Screening for diabetes mellitus   . Bilateral leg cramps    Physical exam - discussed healthy lifestyle, diet, exercise, preventative care, vaccinations, and addressed their concerns.    See your eye doctor yearly for routine vision care.  See your dentist yearly for routine dental care including hygiene visits twice yearly.  HTN - c/t same medication  Hyperlipidemia and xanthelasma  - c/t statin  Obesity - discussed need for lifestyle changes, weight loss.    Gout - c/t allopurinol daily, uses colchicine and indocin prn  Smoker - discussed risks, advised cessation.  He will consider  F/u pending labs  Hendry was seen today for cpe.  Diagnoses and all orders for this visit:  Routine general medical examination at a health care facility -     POCT Urinalysis DIP (Proadvantage Device) -     Comprehensive metabolic panel -     CBC -     Lipid panel -     Hemoglobin A1c -     Magnesium  Essential hypertension -     Comprehensive metabolic panel -     Lipid panel  Eczema, unspecified type  Xanthelasma  Other male erectile dysfunction  Idiopathic gout, unspecified chronicity, unspecified site  Hyperlipidemia, unspecified  hyperlipidemia type -     Lipid panel  Class 1 obesity with serious comorbidity and body mass index (BMI) of 33.0 to 33.9 in adult, unspecified obesity type  Polyarthralgia  Smoker -  Lipid panel  Vaccine counseling  Screening for diabetes mellitus -     Hemoglobin A1c  Bilateral leg cramps -     Magnesium

## 2018-07-13 NOTE — Patient Instructions (Signed)
Thanks for trusting [place] with your health care and for coming in for a physical today.  Below are some general recommendations I have for you:  Yearly screenings See your eye doctor yearly for routine vision care. See your dentist yearly for routine dental care including hygiene visits twice yearly. See me here yearly for a routine physical and preventative care visit   Specific Concerns today:   Shingles vaccine:  I recommend you have a shingles vaccine to help prevent shingles or herpes zoster outbreak.   Please call your insurer to inquire about coverage for the Shingrix vaccine given in 2 doses.   Some insurers cover this vaccine after age 36, some cover this after age 75.  If your insurer covers this, then call to schedule appointment to have this vaccine here.  Work on losing some weight  STOP TOBACCO!!!!!!!!!!!!!!!!!!!!![id number]   Please follow up yearly for a physical.   Preventative Care for Adults - Male      [place]:  A routine yearly physical is a good way to check in with your primary care provider about your health and preventive screening. It is also an opportunity to share updates about your health and any concerns you have, and receive a thorough all-over exam.   Most health insurance companies pay for at least some preventative services.  Check with your health plan for specific coverages.  WHAT PREVENTATIVE SERVICES DO MEN NEED?  Adult men should have their weight and blood pressure checked regularly.   Men age 39 and older should have their cholesterol levels checked regularly.  Beginning at age 60 and continuing to age 67, men should be screened for colorectal cancer.  Certain people may need continued testing until age 22.  Updating vaccinations is part of preventative care.  Vaccinations help protect against diseases such as the flu.  Osteoporosis is a disease in which the bones lose minerals and strength as we age. Men ages 40 and over  should discuss this with their caregivers  Lab tests are generally done as part of preventative care to screen for anemia and blood disorders, to screen for problems with the kidneys and liver, to screen for bladder problems, to check blood sugar, and to check your cholesterol level.  Preventative services generally include counseling about diet, exercise, avoiding tobacco, drugs, excessive alcohol consumption, and sexually transmitted infections.    GENERAL RECOMMENDATIONS FOR GOOD HEALTH:  Healthy diet:  Eat a variety of foods, including fruit, vegetables, animal or vegetable protein, such as meat, fish, chicken, and eggs, or beans, lentils, tofu, and grains, such as rice.  Drink plenty of water daily.  Decrease saturated fat in the diet, avoid lots of red meat, processed foods, sweets, fast foods, and fried foods.  Exercise:  Aerobic exercise helps maintain good heart health. At least 30-40 minutes of moderate-intensity exercise is recommended. For example, a brisk walk that increases your heart rate and breathing. This should be done on most days of the week.   Find a type of exercise or a variety of exercises that you enjoy so that it becomes a part of your daily life.  Examples are running, walking, swimming, water aerobics, and biking.  For motivation and support, explore group exercise such as aerobic class, spin class, Zumba, Yoga,or  martial arts, etc.    Set exercise goals for yourself, such as a certain weight goal, walk or run in a race such as a 5k walk/run.  Speak to your primary care  provider about exercise goals.  Disease prevention:  If you smoke or chew tobacco, find out from your caregiver how to quit. It can literally save your life, no matter how long you have been a tobacco user. If you do not use tobacco, never begin.   Maintain a healthy diet and normal weight. Increased weight leads to problems with blood pressure and diabetes.   The Body Mass Index or BMI is a  way of measuring how much of your body is fat. Having a BMI above 27 increases the risk of heart disease, diabetes, hypertension, stroke and other problems related to obesity. Your caregiver can help determine your BMI and based on it develop an exercise and dietary program to help you achieve or maintain this important measurement at a healthful level.  High blood pressure causes heart and blood vessel problems.  Persistent high blood pressure should be treated with medicine if weight loss and exercise do not work.   Fat and cholesterol leaves deposits in your arteries that can block them. This causes heart disease and vessel disease elsewhere in your body.  If your cholesterol is found to be high, or if you have heart disease or certain other medical conditions, then you may need to have your cholesterol monitored frequently and be treated with medication.   Ask if you should have a cardiac stress test if your history suggests this. A stress test is a test done on a treadmill that looks for heart disease. This test can find disease prior to there being a problem.  Osteoporosis is a disease in which the bones lose minerals and strength as we age. This can result in serious bone fractures. Risk of osteoporosis can be identified using a bone density scan. Men ages 82 and over should discuss this with their caregivers. Ask your caregiver whether you should be taking a calcium supplement and Vitamin D, to reduce the rate of osteoporosis.   Avoid drinking alcohol in excess (more than two drinks per day).  Avoid use of street drugs. Do not share needles with anyone. Ask for professional help if you need assistance or instructions on stopping the use of alcohol, cigarettes, and/or drugs.  Brush your teeth twice a day with fluoride toothpaste, and floss once a day. Good oral hygiene prevents tooth decay and gum disease. The problems can be painful, unattractive, and can cause other health problems. Visit your  dentist for a routine oral and dental check up and preventive care every 6-12 months.   Look at your skin regularly.  Use a mirror to look at your back. Notify your caregivers of changes in moles, especially if there are changes in shapes, colors, a size larger than a pencil eraser, an irregular border, or development of new moles.  Safety:  Use seatbelts 100% of the time, whether driving or as a passenger.  Use safety devices such as hearing protection if you work in environments with loud noise or significant background noise.  Use safety glasses when doing any work that could send debris in to the eyes.  Use a helmet if you ride a bike or motorcycle.  Use appropriate safety gear for contact sports.  Talk to your caregiver about gun safety.  Use sunscreen with a SPF (or skin protection factor) of 15 or greater.  Lighter skinned people are at a greater risk of skin cancer. Don't forget to also wear sunglasses in order to protect your eyes from too much damaging sunlight. Damaging sunlight can accelerate  cataract formation.   Practice safe sex. Use condoms. Condoms are used for birth control and to help reduce the spread of sexually transmitted infections (or STIs).  Some of the STIs are gonorrhea (the clap), chlamydia, syphilis, trichomonas, herpes, HPV (human papilloma virus) and HIV (human immunodeficiency virus) which causes AIDS. The herpes, HIV and HPV are viral illnesses that have no cure. These can result in disability, cancer and death.   Keep carbon monoxide and smoke detectors in your home functioning at all times. Change the batteries every 6 months or use a model that plugs into the wall.   Vaccinations:  Stay up to date with your tetanus shots and other required immunizations. You should have a booster for tetanus every 10 years. Be sure to get your flu shot every year, since 5%-20% of the [place] population comes down with the flu. The flu vaccine changes each year, so being vaccinated  once is not enough. Get your shot in the fall, before the flu season peaks.   Other vaccines to consider:  Human Papilloma Virus or HPV causes cancer of the cervix, and other infections that can be transmitted from person to person. There is a vaccine for HPV, and males should get immunized between the ages of 66 and 57. It requires a series of 3 shots.   Pneumococcal vaccine to protect against certain types of pneumonia.  This is normally recommended for adults age 76 or older.  However, adults younger than 58 years old with certain underlying conditions such as diabetes, heart or lung disease should also receive the vaccine.  Shingles vaccine to protect against Varicella Zoster if you are older than age 90, or younger than 58 years old with certain underlying illness.  If you have not had the Shingrix vaccine, please call your insurer to inquire about coverage for the Shingrix vaccine given in 2 doses.   Some insurers cover this vaccine after age 90, some cover this after age 40.  If your insurer covers this, then call to schedule appointment to have this vaccine here  Hepatitis A vaccine to protect against a form of infection of the liver by a virus acquired from food.  Hepatitis B vaccine to protect against a form of infection of the liver by a virus acquired from blood or body fluids, particularly if you work in health care.  If you plan to travel internationally, check with your local health department for specific vaccination recommendations.   What should I know about cancer screening? Many types of cancers can be detected early and may often be prevented. Lung Cancer  You should be screened every year for lung cancer if: ? You are a current smoker who has smoked for at least 30 years. ? You are a former smoker who has quit within the past 15 years.  Talk to your health care provider about your screening options, when you should start screening, and how often you should be  screened.  Colorectal Cancer  Routine colorectal cancer screening usually begins at 58 years of age and should be repeated every 5-10 years until you are 58 years old. You may need to be screened more often if early forms of precancerous polyps or small growths are found. Your health care provider may recommend screening at an earlier age if you have risk factors for colon cancer.  Your health care provider may recommend using home test kits to check for hidden blood in the stool.  A small camera at the end  of a tube can be used to examine your colon (sigmoidoscopy or colonoscopy). This checks for the earliest forms of colorectal cancer.  Prostate and Testicular Cancer  Depending on your age and overall health, your health care provider may do certain tests to screen for prostate and testicular cancer.  Talk to your health care provider about any symptoms or concerns you have about testicular or prostate cancer.  Skin Cancer  Check your skin from head to toe regularly.  Tell your health care provider about any new moles or changes in moles, especially if: ? There is a change in a mole's size, shape, or color. ? You have a mole that is larger than a pencil eraser.  Always use sunscreen. Apply sunscreen liberally and repeat throughout the day.  Protect yourself by wearing long sleeves, pants, a wide-brimmed hat, and sunglasses when outside.

## 2018-07-14 ENCOUNTER — Other Ambulatory Visit: Payer: Self-pay | Admitting: Medical

## 2018-07-14 LAB — COMPREHENSIVE METABOLIC PANEL
A/G RATIO: 1.9 (ref 1.2–2.2)
ALT: 30 IU/L (ref 0–44)
AST: 23 IU/L (ref 0–40)
Albumin: 5 g/dL — ABNORMAL HIGH (ref 3.8–4.9)
Alkaline Phosphatase: 59 IU/L (ref 39–117)
BILIRUBIN TOTAL: 0.5 mg/dL (ref 0.0–1.2)
BUN / CREAT RATIO: 13 (ref 9–20)
BUN: 15 mg/dL (ref 6–24)
CHLORIDE: 98 mmol/L (ref 96–106)
CO2: 22 mmol/L (ref 20–29)
Calcium: 9.9 mg/dL (ref 8.7–10.2)
Creatinine, Ser: 1.12 mg/dL (ref 0.76–1.27)
GFR calc non Af Amer: 73 mL/min/{1.73_m2} (ref >59)
GFR, EST AFRICAN AMERICAN: 84 mL/min/{1.73_m2} (ref >59)
GLUCOSE: 106 mg/dL — AB (ref 65–99)
Globulin, Total: 2.7 g/dL (ref 1.5–4.5)
POTASSIUM: 5.2 mmol/L (ref 3.5–5.2)
Sodium: 140 mmol/L (ref 134–144)
Total Protein: 7.7 g/dL (ref 6.0–8.5)

## 2018-07-14 LAB — LIPID PANEL
CHOL/HDL RATIO: 4 ratio (ref 0.0–5.0)
CHOLESTEROL TOTAL: 151 mg/dL (ref 100–199)
HDL: 38 mg/dL — ABNORMAL LOW (ref >39)
LDL Calculated: 95 mg/dL (ref 0–99)
TRIGLYCERIDES: 88 mg/dL (ref 0–149)
VLDL Cholesterol Cal: 18 mg/dL (ref 5–40)

## 2018-07-14 LAB — HEMOGLOBIN A1C
ESTIMATED AVERAGE GLUCOSE: 114 mg/dL
Hgb A1c MFr Bld: 5.6 % (ref 4.8–5.6)

## 2018-07-14 LAB — CBC
HEMATOCRIT: 46.6 % (ref 37.5–51.0)
Hemoglobin: 16 g/dL (ref 13.0–17.7)
MCH: 31.8 pg (ref 26.6–33.0)
MCHC: 34.3 g/dL (ref 31.5–35.7)
MCV: 93 fL (ref 79–97)
Platelets: 311 10*3/uL (ref 150–450)
RBC: 5.03 x10E6/uL (ref 4.14–5.80)
RDW: 13.2 % (ref 11.6–15.4)
WBC: 6 10*3/uL (ref 3.4–10.8)

## 2018-07-14 MED ORDER — ALLOPURINOL 300 MG PO TABS: ORAL_TABLET | ORAL | 3 refills | Status: DC

## 2018-07-14 MED ORDER — ASPIRIN 81 MG PO TBEC: DELAYED_RELEASE_TABLET | ORAL | 3 refills | Status: DC

## 2018-07-14 MED ORDER — INDOMETHACIN 50 MG PO CAPS: ORAL_CAPSULE | ORAL | 3 refills | Status: DC

## 2018-07-14 MED ORDER — COLCRYS 0.6 MG PO TABS: 0.6000 mg | ORAL_TABLET | Freq: Every day | ORAL | 3 refills | Status: DC

## 2018-07-14 MED ORDER — ATORVASTATIN CALCIUM 20 MG PO TABS: 20.0000 mg | ORAL_TABLET | Freq: Every day | ORAL | 3 refills | Status: DC

## 2018-07-14 MED ORDER — LISINOPRIL 20 MG PO TABS: 20.0000 mg | ORAL_TABLET | Freq: Every day | ORAL | 3 refills | Status: DC

## 2018-07-15 LAB — MAGNESIUM: Magnesium: 2.1 mg/dL (ref 1.6–2.3)

## 2018-10-27 ENCOUNTER — Other Ambulatory Visit: Payer: Self-pay | Admitting: Medical

## 2019-04-08 ENCOUNTER — Other Ambulatory Visit: Payer: Self-pay

## 2019-04-08 ENCOUNTER — Other Ambulatory Visit (INDEPENDENT_AMBULATORY_CARE_PROVIDER_SITE_OTHER): Payer: 59

## 2019-04-08 DIAGNOSIS — Z23 Encounter for immunization: Secondary | ICD-10-CM | POA: Diagnosis not present

## 2019-07-15 ENCOUNTER — Other Ambulatory Visit: Payer: Self-pay

## 2019-07-15 ENCOUNTER — Encounter: Payer: Self-pay | Admitting: Medical

## 2019-07-15 ENCOUNTER — Ambulatory Visit (INDEPENDENT_AMBULATORY_CARE_PROVIDER_SITE_OTHER): Payer: 59 | Admitting: Medical

## 2019-07-15 VITALS — BP 146/80 | HR 66 | Temp 97.8°F | Ht 68.0 in | Wt 245.2 lb

## 2019-07-15 DIAGNOSIS — M2550 Pain in unspecified joint: Secondary | ICD-10-CM

## 2019-07-15 DIAGNOSIS — Z6833 Body mass index (BMI) 33.0-33.9, adult: Secondary | ICD-10-CM

## 2019-07-15 DIAGNOSIS — Z1211 Encounter for screening for malignant neoplasm of colon: Secondary | ICD-10-CM

## 2019-07-15 DIAGNOSIS — Z89012 Acquired absence of left thumb: Secondary | ICD-10-CM

## 2019-07-15 DIAGNOSIS — Z7185 Encounter for immunization safety counseling: Secondary | ICD-10-CM

## 2019-07-15 DIAGNOSIS — Z0000 Encounter for general adult medical examination without abnormal findings: Secondary | ICD-10-CM | POA: Diagnosis not present

## 2019-07-15 DIAGNOSIS — N528 Other male erectile dysfunction: Secondary | ICD-10-CM | POA: Diagnosis not present

## 2019-07-15 DIAGNOSIS — R202 Paresthesia of skin: Secondary | ICD-10-CM

## 2019-07-15 DIAGNOSIS — E669 Obesity, unspecified: Secondary | ICD-10-CM

## 2019-07-15 DIAGNOSIS — M1000 Idiopathic gout, unspecified site: Secondary | ICD-10-CM

## 2019-07-15 DIAGNOSIS — Z125 Encounter for screening for malignant neoplasm of prostate: Secondary | ICD-10-CM

## 2019-07-15 DIAGNOSIS — H0260 Xanthelasma of unspecified eye, unspecified eyelid: Secondary | ICD-10-CM

## 2019-07-15 DIAGNOSIS — G8929 Other chronic pain: Secondary | ICD-10-CM

## 2019-07-15 DIAGNOSIS — M25571 Pain in right ankle and joints of right foot: Secondary | ICD-10-CM

## 2019-07-15 DIAGNOSIS — I10 Essential (primary) hypertension: Secondary | ICD-10-CM | POA: Diagnosis not present

## 2019-07-15 DIAGNOSIS — K649 Unspecified hemorrhoids: Secondary | ICD-10-CM

## 2019-07-15 DIAGNOSIS — Z7189 Other specified counseling: Secondary | ICD-10-CM

## 2019-07-15 DIAGNOSIS — Z720 Tobacco use: Secondary | ICD-10-CM

## 2019-07-15 DIAGNOSIS — N400 Enlarged prostate without lower urinary tract symptoms: Secondary | ICD-10-CM

## 2019-07-15 DIAGNOSIS — E785 Hyperlipidemia, unspecified: Secondary | ICD-10-CM

## 2019-07-15 DIAGNOSIS — L309 Dermatitis, unspecified: Secondary | ICD-10-CM

## 2019-07-15 DIAGNOSIS — Z1159 Encounter for screening for other viral diseases: Secondary | ICD-10-CM

## 2019-07-15 MED ORDER — HYDROCORTISONE 2.5 % EX CREA: TOPICAL_CREAM | Freq: Two times a day (BID) | CUTANEOUS | 0 refills | Status: DC

## 2019-07-15 NOTE — Progress Notes (Addendum)
Subjective:   HPI  Troy Stevens is a 59 y.o. male who presents for a complete physical.   Chief Complaint  Patient presents with  . Annual Exam    with fasting labs    Medical team: Sees an eye doctor and dentist, just saw the dentist last week Dr. Silvano Rusk, gastroenterology Dr. Daryll Brod, hand surgery Dr. Skeet Latch, cardiology Baron Parmelee, Camelia Eng, PA-C here for primary care  Concerns: He has gained about 8 pounds this past year with Covid restrictions and eating more.  No recent gout issues.  Uses Colcrys and Indocin as needed  He does feel like he has had some bony arthritic changes in his right ankle, feels a prominence on the medial side  He continues to smoke a few black and mild cigars daily, smokes marijuana  Alcohol is hit or miss but no heavy drinking  Him and his wife walk for exercise but lately he has felt numb in his feet when he starts walking.  Not particularly pain just numb.  No calf pain.  No swelling of the legs.  No chest pain no shortness of breath.   Reviewed their medical, surgical, family, social, medication, and allergy history and updated chart as appropriate.  Past Medical History:  Diagnosis Date  . Allergy   . Amputation of thumb    left, s/p conveyer belt injury  . Arthritis   . Atopic dermatitis   . Diverticulosis    2013 colonoscopy, without diverticulitis  . Gout   . Hyperlipidemia   . Hypertension   . 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 COLONOSCOPY  01/31/2012   Sigmoid Diverticulosis, otherwise normal. Repeat 2023, Dr. Silvano Rusk  . FOREARM SURGERY     left trauma repair and skin graft s/p conveyer belt injury  . KNEE SURGERY  2001   left, arthroscopic  . MOUTH SURGERY    . 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 TENDON EXPLORATION Right 01/16/2013   Procedure: TENDON EXPLORATION AND REPAIRS RIGHT RING AND SMALL FINGERS ;   Surgeon: Wynonia Sours, MD;  Location: [facility];  Service: Orthopedics;  Laterality: Right;  . THUMB AMPUTATION      Family History  Problem Relation Age of Onset  . Other Mother        died in [place]  . Other Father        died in [place]  . Arthritis Brother   . Hypertension Brother   . Hypertension Sister   . Stroke Paternal Grandfather   . Diabetes Maternal Grandfather   . Heart disease Sister   . Hyperlipidemia Paternal Aunt   . Cancer Sister        cancer  . Gout Brother   . Hypertension Brother   . Heart disease Brother   . Kidney disease Brother   . Colon cancer Neg Hx   . Stomach cancer Neg Hx     Social History   Socioeconomic History  . Marital status: Married    Spouse name: Not on file  . Number of children: Not on file  . Years of education: Not on file  . Highest education level: Not on file  Occupational History  . Occupation: laid off    Comment: use to work at [employer]  . Smoking status: Light Tobacco Smoker    Packs/day: 0.25  Years: 20.00    Pack years: 5.00    Types: Cigarettes, Cigars    Start date: 05/12/2013  . Smokeless tobacco: Never Used  Substance and Sexual Activity  . Alcohol use: Yes    Alcohol/week: 1.0 standard drinks    Types: 1 Cans of beer per week    Comment: 1-2 beers a week  . Drug use: Yes    Types: Marijuana    Comment: once a week last time 01/15/13  . Sexual activity: Not on file  Other Topics Concern  . Not on file  Social History Narrative   Married, 2 children, exercise - 10,000 steps daily at work.   Working at [employer], pulls orders for paint, and does deliveries.  Walks on the job.  06/2019         Social Determinants of Health   Financial Resource Strain:   . Difficulty of Paying Living Expenses: Not on file  Food Insecurity:   . Worried About Charity fundraiser in the Last Year: Not on file  . Ran Out of Food in the Last Year: Not on file  Transportation Needs:   . Lack  of Transportation (Medical): Not on file  . Lack of Transportation (Non-Medical): Not on file  Physical Activity:   . Days of Exercise per Week: Not on file  . Minutes of Exercise per Session: Not on file  Stress:   . Feeling of Stress : Not on file  Social Connections:   . Frequency of Communication with Friends and Family: Not on file  . Frequency of Social Gatherings with Friends and Family: Not on file  . Attends Religious Services: Not on file  . Active Member of Clubs or Organizations: Not on file  . Attends Archivist Meetings: Not on file  . Marital Status: Not on file  Intimate Partner Violence:   . Fear of Current or Ex-Partner: Not on file  . Emotionally Abused: Not on file  . Physically Abused: Not on file  . Sexually Abused: Not on file    Current Outpatient Medications on File Prior to Visit  Medication Sig Dispense Refill  . allopurinol (ZYLOPRIM) 300 MG tablet TAKE 1 TABLET(300 MG) BY MOUTH DAILY 90 tablet 3  . aspirin (ASPIRIN LOW DOSE) 81 MG EC tablet TAKE 1 TABLET BY MOUTH DAILY. SWALLOW WHOLE 90 tablet 3  . COLCRYS 0.6 MG tablet Take 1 tablet (0.6 mg total) by mouth daily. 30 tablet 3  . indomethacin (INDOCIN) 50 MG capsule TAKE ONE CAPSULE BY MOUTH THREE TIMES DAILY WITH  MEALS for gout flare 60 capsule 3  . lisinopril (PRINIVIL,ZESTRIL) 20 MG tablet Take 1 tablet (20 mg total) by mouth daily. 90 tablet 3  . triamcinolone cream (KENALOG) 0.1 % Apply 1 application topically 2 (two) times daily. 80 g 1  . atorvastatin (LIPITOR) 20 MG tablet Take 1 tablet (20 mg total) by mouth daily. 90 tablet 3   No current facility-administered medications on file prior to visit.    Allergies  Allergen Reactions  . Amlodipine     Ankle edema     Review of Systems Constitutional: -fever, -chills, -sweats, -unexpected weight change, -anorexia, -fatigue Allergy: -sneezing, -itching, -congestion Dermatology: denies changing moles, rash, lumps, new worrisome  lesions ENT: -runny nose, -ear pain, -sore throat, -hoarseness, -sinus pain, -teeth pain, -tinnitus, -hearing loss, -epistaxis Cardiology:  -chest pain, -palpitations, -edema, -orthopnea, -paroxysmal nocturnal dyspnea Respiratory: -cough, -shortness of breath, -dyspnea on exertion, -wheezing, -hemoptysis Gastroenterology: -abdominal  pain, -nausea, -vomiting, -diarrhea, -constipation, -blood in stool, -changes in bowel movement, -dysphagia Hematology: -bleeding or bruising problems Musculoskeletal: +arthralgias, -myalgias,-joint swelling, -back pain, -neck pain, -cramping, -gait changes Ophthalmology: -vision changes, -eye redness, -itching, -discharge Urology: -dysuria, -difficulty urinating, -hematuria, -urinary frequency, -urgency, incontinence Neurology: -headache, -weakness, -tingling, +numbness, -speech abnormality, -memory loss, -falls, -dizziness Psychology:  -depressed mood, -agitation, -sleep problems    Objective:   Physical Exam  BP (!) 146/80   Pulse 66   Temp 97.8 F (36.6 C)   Ht 5\' 8"  (1.727 m)   Wt 245 lb 3.2 oz (111.2 kg)   SpO2 98%   BMI 37.28 kg/m   Wt Readings from Last 3 Encounters:  07/15/19 245 lb 3.2 oz (111.2 kg)  07/13/18 232 lb 12.8 oz (105.6 kg)  01/16/18 230 lb (104.3 kg)    General appearance: alert, no distress, WD/WN, AA male, obese Skin:left upper eyelid with few small slightly raised yellowish lesions c/w xanthelasma, few other benign appearing macules, left lateral forearm throughout from wrist to just proximal to elbow region with large obvious deformity of skin from prior trauma, muscle loss from prior trauma of left forearm, has scar from prior skin grafting, left upper thigh with rectangular skin scar from prior skin graft, nodules under skin with raised skin above left knee, left and right olecranon HEENT: normocephalic, conjunctiva/corneas normal, sclerae anicteric, PERRLA, EOMi, nares patent, no discharge or erythema, pharynx normal Oral  cavity: MMM, tongue normal, teeth in good repair Neck: supple, no lymphadenopathy, no thyromegaly, no masses, normal ROM, no bruits Chest: non tender, normal shape and expansion Heart: RRR, normal S1, S2, no murmurs Lungs: CTA bilaterally, no wheezes, rhonchi, or rales  Abdomen: +bs, soft, non tender, non distended, no masses, no hepatomegaly, no splenomegaly, no bruits Back: non tender, normal ROM, no scoliosis Musculoskeletal: left hand s/p thumb amputation and scarring from prior skin grafting left lateral forearm, bony prominence of the right anterior and medial ankle with decreased right foot plantar and dorsiflexion and inversion,, otherwise no obvious swelling, upper extremities non tender, no obvious deformity, normal ROM throughout, lower extremities non tender, no obvious deformity, normal ROM throughout, left knee port surgical scars Extremities: no edema, no cyanosis, no clubbing Pulses: 2+ symmetric, upper and lower extremities, normal cap refill Neurological: alert, oriented x 3, CN2-12 intact, strength normal upper extremities and lower extremities, sensation normal throughout, DTRs 2+ throughout, no cerebellar signs, gait normal Psychiatric: normal affect, behavior normal, pleasant  GU-Penis and external genitalia normal appearing, circumcised, no mass, lesions or lymphadenopathy , no hernia DRE: Small non inflamed external hemorrhoids anterior and posterior, no other external lesions, prostate moderately enlarged, no nodules, no fissure    Assessment and Plan :    Encounter Diagnoses  Name Primary?  . Encounter for health maintenance examination in adult Yes  . Essential hypertension   . Other male erectile dysfunction   . Idiopathic gout, unspecified chronicity, unspecified site   . Xanthelasma   . Vaccine counseling   . Polyarthralgia   . Hyperlipidemia, unspecified hyperlipidemia type   . Class 1 obesity with serious comorbidity and body mass index (BMI) of 33.0 to  33.9 in adult, unspecified obesity type   . Eczema, unspecified type   . Screen for colon cancer   . Screening for prostate cancer   . Paresthesia of both lower extremities   . Encounter for hepatitis C screening test for low risk patient   . Chronic pain of right ankle   . Status  post amputation of left thumb   . Tobacco use   . Hemorrhoids, unspecified hemorrhoid type   . Prostate enlargement    Physical exam - discussed healthy lifestyle, diet, exercise, preventative care, vaccinations, and addressed their concerns.    See your eye doctor yearly for routine vision care. See your dentist yearly for routine dental care including hygiene visits twice yearly.  Cancer screening: I reviewed his 2013 colonoscopy report, reviewed the stool cards done in 2019 that were negative  Prostate screening-PSA today after discussing risk/benefits  Skin surveillance advised   Vaccines:  He is up-to-date on tetanus vaccine  He is up-to-date on influenza vaccine  He is up-to-date on pneumococcal vaccine  Counseled on the need for Covid and Shingrix vaccines.  He plans to get the Covid vaccine when it becomes available first  Other issues discussed: HTN - c/t same medication  Hyperlipidemia and xanthelasma  - c/t statin  Obesity - discussed need for lifestyle changes, weight loss.  Unfortunately again weight this past year  Gout - c/t allopurinol daily, uses colchicine and indocin prn  Smoker - discussed risks, advised cessation.  He will consider  Ankle pain, decreased ankle range of motion-we will refer for x-ray  Hemorrhoids-discussed diagnosis, symptoms, supportive care, and can use hydrocortisone cream sent today as needed short-term at a time  Numbness in the legs with walking-labs today, consider ABIs screen  F/u pending labs  Aladino was seen today for annual exam.  Diagnoses and all orders for this visit:  Encounter for health maintenance examination in adult -      Comprehensive metabolic panel -     CBC with Differential/Platelet -     Lipid panel -     Hepatitis C antibody -     PSA -     HIV Antibody (routine testing w rflx) -     Vitamin B12 -     VITAMIN D 25 Hydroxy (Vit-D Deficiency, Fractures) -     CYCLIC CITRUL PEPTIDE ANTIBODY, IGG/IGA -     Rheumatoid factor  Essential hypertension -     Comprehensive metabolic panel -     Lipid panel  Other male erectile dysfunction  Idiopathic gout, unspecified chronicity, unspecified site  Xanthelasma  Vaccine counseling  Polyarthralgia -     VITAMIN D 25 Hydroxy (Vit-D Deficiency, Fractures) -     CYCLIC CITRUL PEPTIDE ANTIBODY, IGG/IGA -     Rheumatoid factor  Hyperlipidemia, unspecified hyperlipidemia type -     Comprehensive metabolic panel -     Lipid panel  Class 1 obesity with serious comorbidity and body mass index (BMI) of 33.0 to 33.9 in adult, unspecified obesity type  Eczema, unspecified type -     VITAMIN D 25 Hydroxy (Vit-D Deficiency, Fractures)  Screen for colon cancer  Screening for prostate cancer -     PSA  Paresthesia of both lower extremities -     Vitamin B12  Encounter for hepatitis C screening test for low risk patient -     Hepatitis C antibody  Chronic pain of right ankle -     DG Ankle Complete Right; Future  Status post amputation of left thumb  Tobacco use  Hemorrhoids, unspecified hemorrhoid type  Prostate enlargement  Other orders -     hydrocortisone 2.5 % cream; Apply topically 2 (two) times daily.  Follow-up pending labs and x-ray

## 2019-07-15 NOTE — Patient Instructions (Addendum)
Please go to [facility] for your right ankle xray.   Their hours are 8am - 4:30 pm Monday - Friday.  Take your insurance card with you.  [facility] [phone]  [address] [address], [place], [place] [address]  [address], [place] [address]    Numbness in the legs-you have decreased pulses in the legs.  If your labs are okay I am going to recommend adding a blood flow study called ankle-brachial index to screen for vascular disease in your legs.  I am checking some labs that may also help determine why you feel numb in the legs.   I recommend you not smoke at all, recommend you quit smoking altogether.   I recommend you work on losing weight through healthy diet and exercise.    Hemorrhoids:  I prescribed Hydrocortisone 2.5% cream today.  You can apply this topically to the external hemorrhoid twice daily short term for 3-5 days at a time as needed for itching, irritations, or swelling   Hemorrhoids Hemorrhoids are swollen veins in and around the rectum or anus. There are two types of hemorrhoids:  Internal hemorrhoids. These occur in the veins that are just inside the rectum. They may poke through to the outside and become irritated and painful.  External hemorrhoids. These occur in the veins that are outside of the anus and can be felt as a painful swelling or hard lump near the anus.  Most hemorrhoids do not cause serious problems, and they can be managed with home treatments such as diet and lifestyle changes. If home treatments do not help your symptoms, procedures can be done to shrink or remove the hemorrhoids. What are the causes? This condition is caused by increased pressure in the anal area. This pressure may result from various things, including:  Constipation.  Straining to have a bowel movement.  Diarrhea.  Pregnancy.  Obesity.  Sitting for long periods of time.  Heavy lifting or other activity that causes you to  strain.  Anal sex.  What are the signs or symptoms? Symptoms of this condition include:  Pain.  Anal itching or irritation.  Rectal bleeding.  Leakage of stool (feces).  Anal swelling.  One or more lumps around the anus.  How is this diagnosed? This condition can often be diagnosed through a visual exam. Other exams or tests may also be done, such as:  Examination of the rectal area with a gloved hand (digital rectal exam).  Examination of the anal canal using a small tube (anoscope).  A blood test, if you have lost a significant amount of blood.  A test to look inside the colon (sigmoidoscopy or colonoscopy).  How is this treated? This condition can usually be treated at home. However, various procedures may be done if dietary changes, lifestyle changes, and other home treatments do not help your symptoms. These procedures can help make the hemorrhoids smaller or remove them completely. Some of these procedures involve surgery, and others do not. Common procedures include:  Rubber band ligation. Rubber bands are placed at the base of the hemorrhoids to cut off the blood supply to them.  Sclerotherapy. Medicine is injected into the hemorrhoids to shrink them.  Infrared coagulation. A type of light energy is used to get rid of the hemorrhoids.  Hemorrhoidectomy surgery. The hemorrhoids are surgically removed, and the veins that supply them are tied off.  Stapled hemorrhoidopexy surgery. A circular stapling device is used to remove the hemorrhoids and use  staples to cut off the blood supply to them.  Follow these instructions at home: Eating and drinking  Eat foods that have a lot of fiber in them, such as whole grains, beans, nuts, fruits, and vegetables. Ask your health care provider about taking products that have added fiber (fiber supplements).  Drink enough fluid to keep your urine clear or pale yellow. Managing pain and swelling  Take warm sitz baths for 20  minutes, 3-4 times a day to ease pain and discomfort.  If directed, apply ice to the affected area. Using ice packs between sitz baths may be helpful. ? Put ice in a plastic bag. ? Place a towel between your skin and the bag. ? Leave the ice on for 20 minutes, 2-3 times a day. General instructions  Take over-the-counter and prescription medicines only as told by your health care provider.  Use medicated creams or suppositories as told.  Exercise regularly.  Go to the bathroom when you have the urge to have a bowel movement. Do not wait.  Avoid straining to have bowel movements.  Keep the anal area dry and clean. Use wet toilet paper or moist towelettes after a bowel movement.  Do not sit on the toilet for long periods of time. This increases blood pooling and pain. Contact a health care provider if:  You have increasing pain and swelling that are not controlled by treatment or medicine.  You have uncontrolled bleeding.  You have difficulty having a bowel movement, or you are unable to have a bowel movement.  You have pain or inflammation outside the area of the hemorrhoids. This information is not intended to replace advice given to you by your health care provider. Make sure you discuss any questions you have with your health care provider. Document Released: 05/27/2000 Document Revised: 10/28/2015 Document Reviewed: 02/11/2015 Elsevier Interactive Patient Education  [employer].

## 2019-07-16 ENCOUNTER — Other Ambulatory Visit: Payer: Self-pay | Admitting: Medical

## 2019-07-16 DIAGNOSIS — R0989 Other specified symptoms and signs involving the circulatory and respiratory systems: Secondary | ICD-10-CM

## 2019-07-16 DIAGNOSIS — F17200 Nicotine dependence, unspecified, uncomplicated: Secondary | ICD-10-CM

## 2019-07-16 DIAGNOSIS — R202 Paresthesia of skin: Secondary | ICD-10-CM

## 2019-07-16 LAB — COMPREHENSIVE METABOLIC PANEL
ALT: 32 IU/L (ref 0–44)
AST: 20 IU/L (ref 0–40)
Albumin/Globulin Ratio: 2.2 (ref 1.2–2.2)
Albumin: 4.8 g/dL (ref 3.8–4.9)
Alkaline Phosphatase: 56 IU/L (ref 39–117)
BUN/Creatinine Ratio: 19 (ref 9–20)
BUN: 21 mg/dL (ref 6–24)
Bilirubin Total: 0.3 mg/dL (ref 0.0–1.2)
CO2: 22 mmol/L (ref 20–29)
Calcium: 10.1 mg/dL (ref 8.7–10.2)
Chloride: 101 mmol/L (ref 96–106)
Creatinine, Ser: 1.13 mg/dL (ref 0.76–1.27)
GFR calc Af Amer: 82 mL/min/{1.73_m2} (ref >59)
GFR calc non Af Amer: 71 mL/min/{1.73_m2} (ref >59)
Globulin, Total: 2.2 g/dL (ref 1.5–4.5)
Glucose: 105 mg/dL — ABNORMAL HIGH (ref 65–99)
Potassium: 4.7 mmol/L (ref 3.5–5.2)
Sodium: 138 mmol/L (ref 134–144)
Total Protein: 7 g/dL (ref 6.0–8.5)

## 2019-07-16 LAB — LIPID PANEL
Chol/HDL Ratio: 3.2 ratio (ref 0.0–5.0)
Cholesterol, Total: 140 mg/dL (ref 100–199)
HDL: 44 mg/dL (ref >39)
LDL Chol Calc (NIH): 79 mg/dL (ref 0–99)
Triglycerides: 90 mg/dL (ref 0–149)
VLDL Cholesterol Cal: 17 mg/dL (ref 5–40)

## 2019-07-16 LAB — CBC WITH DIFFERENTIAL/PLATELET
Basophils Absolute: 0.1 10*3/uL (ref 0.0–0.2)
Basos: 1 %
EOS (ABSOLUTE): 0.2 10*3/uL (ref 0.0–0.4)
Eos: 3 %
Hematocrit: 43.7 % (ref 37.5–51.0)
Hemoglobin: 15.3 g/dL (ref 13.0–17.7)
Immature Grans (Abs): 0 10*3/uL (ref 0.0–0.1)
Immature Granulocytes: 0 %
Lymphocytes Absolute: 2 10*3/uL (ref 0.7–3.1)
Lymphs: 29 %
MCH: 32.1 pg (ref 26.6–33.0)
MCHC: 35 g/dL (ref 31.5–35.7)
MCV: 92 fL (ref 79–97)
Monocytes Absolute: 0.5 10*3/uL (ref 0.1–0.9)
Monocytes: 7 %
Neutrophils Absolute: 4.2 10*3/uL (ref 1.4–7.0)
Neutrophils: 60 %
Platelets: 298 10*3/uL (ref 150–450)
RBC: 4.77 x10E6/uL (ref 4.14–5.80)
RDW: 13 % (ref 11.6–15.4)
WBC: 6.9 10*3/uL (ref 3.4–10.8)

## 2019-07-16 LAB — VITAMIN B12: Vitamin B-12: 648 pg/mL (ref 232–1245)

## 2019-07-16 LAB — RHEUMATOID FACTOR: Rheumatoid fact SerPl-aCnc: <10 IU/mL (ref 0.0–13.9)

## 2019-07-16 LAB — PSA: Prostate Specific Ag, Serum: 0.9 ng/mL (ref 0.0–4.0)

## 2019-07-16 LAB — HIV ANTIBODY (ROUTINE TESTING W REFLEX): HIV Screen 4th Generation wRfx: NONREACTIVE

## 2019-07-16 LAB — VITAMIN D 25 HYDROXY (VIT D DEFICIENCY, FRACTURES): Vit D, 25-Hydroxy: 13.6 ng/mL — ABNORMAL LOW (ref 30.0–100.0)

## 2019-07-16 LAB — CYCLIC CITRUL PEPTIDE ANTIBODY, IGG/IGA: Cyclic Citrullin Peptide Ab: 4 units (ref 0–19)

## 2019-07-16 LAB — HEPATITIS C ANTIBODY: Hep C Virus Ab: <0.1 s/co ratio (ref 0.0–0.9)

## 2019-07-16 MED ORDER — LISINOPRIL 20 MG PO TABS: 20.0000 mg | ORAL_TABLET | Freq: Every day | ORAL | 3 refills | Status: DC

## 2019-07-16 MED ORDER — ASPIRIN 81 MG PO TBEC: DELAYED_RELEASE_TABLET | ORAL | 3 refills | Status: DC

## 2019-07-16 MED ORDER — ATORVASTATIN CALCIUM 20 MG PO TABS: 20.0000 mg | ORAL_TABLET | Freq: Every day | ORAL | 3 refills | Status: DC

## 2019-07-16 MED ORDER — VITAMIN D 25 MCG (1000 UNIT) PO TABS: 1000.0000 [IU] | ORAL_TABLET | Freq: Every day | ORAL | 3 refills | Status: DC

## 2019-07-16 MED ORDER — COLCRYS 0.6 MG PO TABS: 0.6000 mg | ORAL_TABLET | Freq: Every day | ORAL | 3 refills | Status: DC

## 2019-07-16 MED ORDER — ALLOPURINOL 300 MG PO TABS: ORAL_TABLET | ORAL | 3 refills | Status: DC

## 2019-07-16 MED ORDER — INDOMETHACIN 50 MG PO CAPS: ORAL_CAPSULE | ORAL | 3 refills | Status: DC

## 2019-07-16 NOTE — Addendum Note (Signed)
Addended by: [name] on: 07/16/2019 02:29 PM   Modules accepted: Orders

## 2019-07-16 NOTE — Addendum Note (Signed)
Addended by: [name] on: 07/16/2019 02:25 PM   Modules accepted: Orders

## 2019-07-17 ENCOUNTER — Other Ambulatory Visit: Payer: Self-pay | Admitting: Medical

## 2019-07-17 MED ORDER — COLCHICINE 0.6 MG PO TABS: 0.6000 mg | ORAL_TABLET | Freq: Two times a day (BID) | ORAL | 2 refills | Status: DC | PRN

## 2019-07-24 ENCOUNTER — Other Ambulatory Visit: Payer: Self-pay | Admitting: Medical

## 2019-07-24 DIAGNOSIS — R0989 Other specified symptoms and signs involving the circulatory and respiratory systems: Secondary | ICD-10-CM

## 2019-07-24 DIAGNOSIS — R202 Paresthesia of skin: Secondary | ICD-10-CM

## 2019-08-02 ENCOUNTER — Ambulatory Visit (HOSPITAL_COMMUNITY): Admission: RE | Admit: 2019-08-02 | Payer: 59 | Source: Ambulatory Visit

## 2019-08-13 ENCOUNTER — Other Ambulatory Visit: Payer: Self-pay

## 2019-08-13 ENCOUNTER — Ambulatory Visit (HOSPITAL_COMMUNITY)
Admission: RE | Admit: 2019-08-13 | Discharge: 2019-08-13 | Disposition: A | Discharge status: Home / Self Care | Payer: 59 | Source: Ambulatory Visit | Attending: Cardiology | Admitting: Cardiology

## 2019-08-13 DIAGNOSIS — R0989 Other specified symptoms and signs involving the circulatory and respiratory systems: Secondary | ICD-10-CM | POA: Diagnosis present

## 2019-08-13 DIAGNOSIS — R202 Paresthesia of skin: Secondary | ICD-10-CM | POA: Diagnosis not present

## 2019-08-16 ENCOUNTER — Other Ambulatory Visit: Payer: Self-pay | Admitting: Medical

## 2019-08-23 ENCOUNTER — Ambulatory Visit (INDEPENDENT_AMBULATORY_CARE_PROVIDER_SITE_OTHER): Payer: 59 | Admitting: Medical

## 2019-08-23 ENCOUNTER — Other Ambulatory Visit: Payer: Self-pay

## 2019-08-23 ENCOUNTER — Encounter: Payer: Self-pay | Admitting: Medical

## 2019-08-23 VITALS — BP 130/80 | HR 74 | Temp 98.7°F | Ht 68.0 in | Wt 242.4 lb

## 2019-08-23 DIAGNOSIS — M5116 Intervertebral disc disorders with radiculopathy, lumbar region: Secondary | ICD-10-CM | POA: Diagnosis not present

## 2019-08-23 DIAGNOSIS — M25571 Pain in right ankle and joints of right foot: Secondary | ICD-10-CM

## 2019-08-23 DIAGNOSIS — G8929 Other chronic pain: Secondary | ICD-10-CM

## 2019-08-23 DIAGNOSIS — R200 Anesthesia of skin: Secondary | ICD-10-CM | POA: Diagnosis not present

## 2019-08-23 DIAGNOSIS — R202 Paresthesia of skin: Secondary | ICD-10-CM | POA: Diagnosis not present

## 2019-08-23 MED ORDER — PREDNISONE 10 MG PO TABS: ORAL_TABLET | ORAL | 0 refills | Status: DC

## 2019-08-23 NOTE — Progress Notes (Signed)
Subjective:  Troy Stevens is a 59 y.o. male who presents for Chief Complaint  Patient presents with  . Follow-up    leg      At his recent physical he complained of numbness in both feet and legs.  Worse when he walks.  He noticed this past week he was lifting too heavy 5 gallon buckets of fluid simultaneously of both arms while doing this he felt numbness in his feet that resolved once he quit carrying the heavy objects.  Here to follow-up on the ABI he had recently. Every night gets pains in left leg behind knee, it awakes him every morning about 2 am.  yesterday feet was going numb.  No other new complaint  The following portions of the patient's history were reviewed and updated as appropriate: allergies, current medications, past family history, past medical history, past social history, past surgical history and problem list.  ROS Otherwise as in subjective above  Objective: BP 130/80   Pulse 74   Temp 98.7 F (37.1 C)   Ht 5\' 8"  (1.727 m)   Wt 242 lb 6.4 oz (110 kg)   SpO2 94%   BMI 36.86 kg/m   General appearance: alert, no distress, well developed, well nourished 1+ pulses bilateral feet, Back normal range of motion, nontender, no deformity bony prominence of the right anterior and medial ankle with decreased right foot plantar and dorsiflexion and inversion,, left knee port surgical scars, rest of Legs nontender with range of motion, no swelling, normal hip range of motion, no laxity of joints. Normal leg strength and sensation and DTRs, negative straight leg raise, normal heel and toe walk    Assessment: Encounter Diagnoses  Name Primary?  . Paresthesia of both lower extremities Yes  . Chronic pain of right ankle   . Lumbar disc disease with radiculopathy   . Numbness and tingling of foot      Plan: We reviewed his recent ABI blood flow study which was normal  He had an abnormal lumbar spine x-ray in 2014 showed retrolisthesis and degenerative changes.  We  will refer to Ortho at this point for further evaluation and management of both the ankle deformity, ankle pain and radicular symptoms in the feet which is likely due to a lumbar spine issue  Troy Stevens was seen today for follow-up.  Diagnoses and all orders for this visit:  Paresthesia of both lower extremities -     Ambulatory referral to Orthopedic Surgery  Chronic pain of right ankle -     Ambulatory referral to Orthopedic Surgery  Lumbar disc disease with radiculopathy -     Ambulatory referral to Orthopedic Surgery  Numbness and tingling of foot -     Ambulatory referral to Orthopedic Surgery  Other orders -     predniSONE (DELTASONE) 10 MG tablet; 6/5/4/3/2/1 taper    Follow up: pending referral

## 2019-08-28 ENCOUNTER — Encounter: Payer: Self-pay | Admitting: Family Medicine

## 2019-08-28 ENCOUNTER — Ambulatory Visit (INDEPENDENT_AMBULATORY_CARE_PROVIDER_SITE_OTHER): Payer: 59 | Admitting: Family Medicine

## 2019-08-28 ENCOUNTER — Other Ambulatory Visit: Payer: Self-pay

## 2019-08-28 DIAGNOSIS — M5442 Lumbago with sciatica, left side: Secondary | ICD-10-CM

## 2019-08-28 MED ORDER — GABAPENTIN 100 MG PO CAPS: ORAL_CAPSULE | ORAL | 3 refills | Status: DC

## 2019-08-28 MED ORDER — VITAMIN D-3 125 MCG (5000 UT) PO TABS: ORAL_TABLET | ORAL | 3 refills | Status: DC

## 2019-08-28 NOTE — Progress Notes (Signed)
Office Visit Note   Patient: Troy Stevens           Date of Birth: Apr 22, 1961           MRN: [id number] Visit Date: 08/28/2019 Requested by: Carlena Hurl, PA-C [address] [place],  [place] [address] PCP: Carlena Hurl, PA-C  Subjective: Chief Complaint  Patient presents with  . Lower Back - Pain    Intermittent pain across the lower back x couple months. Numbness/tingling in feet, usually one or the other - not at the same time. This occurs mostly with walking. Pain in left lateral knee. S/p ABIs 08/13/19.    HPI: He is here with low back and bilateral leg pain.  Back pain for the past 2 or 3 months, no injury.  He has a fairly physically demanding job and his back bothers him intermittently when lifting heavy objects.  But recently he started having increasing pain when walking for exercise with his wife.  He also gets numbness in his feet that goes away when he sits back down again.  No bowel or bladder dysfunction, no definite weakness in his legs.  He is not taking any medication for the pain because it is not constant.  In his record there are x-rays from 2014.  He does not remember why he had those, but he has not had any troubles since then.  No history of diabetes.  He does have gout which is well controlled.              ROS:   All other systems were reviewed and are negative.  Objective: Vital Signs: There were no vitals taken for this visit.  Physical Exam:  General:  Alert and oriented, in no acute distress. Pulm:  Breathing unlabored. Psy:  Normal mood, congruent affect.  Low back: He has some tenderness in the midline near the L4-5 and L5-S1 level.  No significant pain in the sciatic notch or the SI joints.  Negative straight leg raise, lower extremity strength and reflexes are normal.  Imaging: None today but 2014 x-rays show significant degenerative disc disease at L3-4 with mild to moderate changes at other levels.  Assessment & Plan: 1.  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 -We will try physical therapy, gabapentin at night.  He will contact me in a few weeks if he is not getting better, and we will order new lumbar x-rays and an MRI scan.     Procedures: No procedures performed  No notes on file     PMFS History: Patient Active Problem List   Diagnosis Date Noted  . Paresthesia of both lower extremities 07/15/2019  . Chronic pain of right ankle 07/15/2019  . 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 Tobacco use 07/15/2019  . Hemorrhoids 07/15/2019  . Prostate enlargement 07/15/2019  . Routine general medical examination at a health care facility 07/12/2017  . Encounter for hepatitis C screening test for low risk patient 07/12/2017  . Xanthelasma 05/12/2016  . Essential hypertension 03/02/2015  . Encounter for health maintenance examination in adult 03/02/2015  . Smoker 03/02/2015  . Vaccine counseling 03/02/2015  . Eczema 03/02/2015  . Need for prophylactic vaccination and inoculation against influenza 03/02/2015  . Gout 01/04/2012  . ED (erectile dysfunction) 01/04/2012  . Hyperlipidemia 12/13/2011  . Obesity 12/13/2011  . Polyarthralgia 12/13/2011   Past Medical History:  Diagnosis Date  . Allergy   . Amputation of  thumb    left, s/p conveyer belt injury  . Arthritis   . Atopic dermatitis   . Diverticulosis    2013 colonoscopy, without diverticulitis  . Gout   . Hyperlipidemia   . Hypertension   . 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 Other Mother        died in [place]  . Other Father        died in [place]  . Arthritis Brother   . Hypertension Brother   . Hypertension Sister   . Stroke Paternal Grandfather   . Diabetes Maternal Grandfather   . Heart disease Sister   . Hyperlipidemia Paternal Aunt   . Cancer Sister        cancer  . Gout Brother   . Hypertension Brother   . Heart disease  Brother   . Kidney disease Brother   . Colon cancer Neg Hx   . Stomach cancer Neg Hx     Past Surgical History:  Procedure Laterality Date  . COLONOSCOPY  01/31/2012   Sigmoid Diverticulosis, otherwise normal. Repeat 2023, Dr. Silvano Rusk  . FOREARM SURGERY     left trauma repair and skin graft s/p conveyer belt injury  . KNEE SURGERY  2001   left, arthroscopic  . MOUTH SURGERY    . 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 TENDON EXPLORATION Right 01/16/2013   Procedure: TENDON EXPLORATION AND REPAIRS RIGHT RING AND SMALL FINGERS ;  Surgeon: Wynonia Sours, MD;  Location: [facility];  Service: Orthopedics;  Laterality: Right;  . THUMB AMPUTATION     Social History   Occupational History  . Occupation: laid off    Comment: use to work at [employer]  . Smoking status: Light Tobacco Smoker    Packs/day: 0.25    Years: 20.00    Pack years: 5.00    Types: Cigarettes, Cigars    Start date: 05/12/2013  . Smokeless tobacco: Never Used  Substance and Sexual Activity  . Alcohol use: Yes    Alcohol/week: 1.0 standard drinks    Types: 1 Cans of beer per week    Comment: 1-2 beers a week  . Drug use: Yes    Types: Marijuana    Comment: once a week last time 01/15/13  . Sexual activity: Not on file

## 2019-09-05 ENCOUNTER — Ambulatory Visit: Payer: 59 | Admitting: Physical Therapy

## 2019-09-05 ENCOUNTER — Encounter: Payer: Self-pay | Admitting: Physical Therapy

## 2019-09-05 ENCOUNTER — Other Ambulatory Visit: Payer: Self-pay

## 2019-09-05 DIAGNOSIS — M545 Low back pain, unspecified: Secondary | ICD-10-CM

## 2019-09-05 NOTE — Therapy (Signed)
[facility] [address] [place], [place], [address] Phone: [phone]   Fax:  [phone]  Physical Therapy Evaluation  Patient Details  Name: Troy Stevens MRN: [id number] Date of Birth: 11-Nov-1960 Referring Provider (PT): Eunice Blase, MD   Encounter Date: 09/05/2019  PT End of Session - 09/05/19 1442    Visit Number  1    Number of Visits  1   likely only one time visit, PT will reassess if he feels he needs to come back   PT Start Time  1400    PT Stop Time  1436    PT Time Calculation (min)  36 min    Activity Tolerance  Patient tolerated treatment well    Behavior During Therapy  [facility] for tasks assessed/performed       Past Medical History:  Diagnosis Date  . Allergy   . Amputation of thumb    left, s/p conveyer belt injury  . Arthritis   . Atopic dermatitis   . Diverticulosis    2013 colonoscopy, without diverticulitis  . Gout   . Hyperlipidemia   . Hypertension   . 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 COLONOSCOPY  01/31/2012   Sigmoid Diverticulosis, otherwise normal. Repeat 2023, Dr. Silvano Rusk  . FOREARM SURGERY     left trauma repair and skin graft s/p conveyer belt injury  . KNEE SURGERY  2001   left, arthroscopic  . MOUTH SURGERY    . 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 TENDON EXPLORATION Right 01/16/2013   Procedure: TENDON EXPLORATION AND REPAIRS RIGHT RING AND SMALL FINGERS ;  Surgeon: Wynonia Sours, MD;  Location: [facility];  Service: Orthopedics;  Laterality: Right;  . THUMB AMPUTATION      There were no vitals filed for this visit.   Subjective Assessment - 09/05/19 1404    Subjective  He is here with low back and bilateral leg pain that is intermittent and much improved since last week. Denies feelings of radiculopathy in his legs and pain is more of a tightness.  Back pain for the past 2 or 3  months, no injury.  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 He also gets numbness in his feet that goes away when he sits back down again. He does not know any activity or position that does this and it is only intermittently.  No bowel or bladder dysfunction, no definite weakness in his legs. He does say every night he gets some tightness in pain in his Lt posteriorlateral knee    Pertinent History  PMH: amputation of Lt thumb, lumbar OA,HTN, Lt knee scope    Diagnostic tests  Imaging: 2014 x-rays show significant degenerative disc disease at L3-4 with mild to moderate changes at other levels.    Patient Stated Goals  improve the back pain    Currently in Pain?  Yes    Pain Score  1     Pain Location  Back    Pain Orientation  Right;Left;Lower    Pain Descriptors / Indicators  Aching    Pain Type  Acute pain    Pain Radiating Towards  sometimes his feet go numb    Pain Onset  More than a month ago    Pain Frequency  Intermittent    Aggravating Factors  not sure    Pain Relieving Factors  rest         [facility] PT Assessment - 09/05/19 0001      Assessment   Medical Diagnosis  Low back pain    Referring Provider (PT)  Hilts, Michael, MD    Onset Date/Surgical Date  --   3 month onset of pain   Hand Dominance  Right    Next MD Visit  PRN    Prior Therapy  nothing recent      Precautions   Precautions  None      Restrictions   Weight Bearing Restrictions  No      Balance Screen   Has the patient fallen in the past 6 months  No      [place] residence      Prior Function   Level of Independence  Independent    Vocation  Full time employment    Vocation Requirements  delivers paint, 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    Leisure  walk, play with grandkids      Cognition   Overall Cognitive Status  Within Functional Limits for tasks assessed       Observation/Other Assessments-Edema    Edema  --   mild edema in Rt knee     Sensation   Light Touch  Appears Intact      Coordination   Gross Motor Movements are Fluid and Coordinated  Yes      Posture/Postural Control   Posture Comments  minimal Rt trunk shift      ROM / Strength   AROM / PROM / Strength  AROM;Strength      AROM   Overall AROM Comments  lumbar ROM WNL, Knee extension missing 5 deg due to hamstring tightness      Strength   Overall Strength Comments  bilat Leg strength 5/5      Flexibility   Soft Tissue Assessment /Muscle Length  --   tight H.S, hip rotators     Palpation   Spinal mobility  WFL, some mild pain at L5 S1      Special Tests   Other special tests  neg slump test, neg FABERS test, neg SLR test, + quadrant test on Rt      Transfers   Transfers  Independent with all Transfers      Ambulation/Gait   Gait Comments  WNL                Objective measurements completed on examination: See above findings.              PT Education - 09/05/19 1442    Education Details  HEP, POC, exam findings    Person(s) Educated  Patient    Methods  Explanation;Demonstration;Verbal cues;Handout    Comprehension  Verbalized understanding          PT Long Term Goals - 09/05/19 1446      PT LONG TERM GOAL #1   Title  will write goals in the event he returns             Plan - 09/05/19 1443    Clinical Impression Statement  Pt was having acute on chronic LBP but this appears to have resolved. He denies any radicular symptoms but does have tightness/pain in lateral Lt knee at night. He has good overall lumbar ROM and leg strength but does have some hip/knee tighntess  that may be contributing to his overall pain. He was given HEP to trial for his knee/hip tightness and will likely only be a one time PT visit. He will call back to schedule if needed.    Stability/Clinical Decision Making  Stable/Uncomplicated    Clinical  Decision Making  Low    Rehab Potential  Excellent    PT Frequency  One time visit    PT Next Visit Plan  will hold case open 30 days and DC if he does not return    PT Home Exercise Plan  Signed     Access Code: [email or web address]       Patient will benefit from skilled therapeutic intervention in order to improve the following deficits and impairments:     Visit Diagnosis: Acute bilateral low back pain, unspecified whether sciatica present     Problem List Patient Active Problem List   Diagnosis Date Noted  . Paresthesia of both lower extremities 07/15/2019  . Chronic pain of right ankle 07/15/2019  . 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 Tobacco use 07/15/2019  . Hemorrhoids 07/15/2019  . Prostate enlargement 07/15/2019  . Routine general medical examination at a health care facility 07/12/2017  . Encounter for hepatitis C screening test for low risk patient 07/12/2017  . Xanthelasma 05/12/2016  . Essential hypertension 03/02/2015  . Encounter for health maintenance examination in adult 03/02/2015  . Smoker 03/02/2015  . Vaccine counseling 03/02/2015  . Eczema 03/02/2015  . Need for prophylactic vaccination and inoculation against influenza 03/02/2015  . Gout 01/04/2012  . ED (erectile dysfunction) 01/04/2012  . Hyperlipidemia 12/13/2011  . Obesity 12/13/2011  . Polyarthralgia 12/13/2011    Debbe Odea, PT,DPT 09/05/2019, 2:48 PM  [facility] [address] [place], [place], [address] Phone: [phone]   Fax:  [phone]  Name: Shion Stifel MRN: [id number] Date of Birth: 08-Aug-1960

## 2019-09-05 NOTE — Patient Instructions (Signed)
Access Code: [email or web address]: [email or web address]: 03/25/2021Prepared by: Aaron Edelman NelsonExercises  Supine Piriformis Stretch - 2 x daily - 6 x weekly - 3 reps - 1 sets - 30 hold  Supine Hamstring Stretch with Strap - 2 x daily - 6 x weekly - 3 reps - 1 sets - 30 hold  Supine ITB Stretch with Strap - 2 x daily - 6 x weekly - 2-3 reps - 30 hold  Supine Lower Trunk Rotation - 2 x daily - 6 x weekly - 1 sets - 5 reps - 10 hold  Supine Bridge - 2 x daily - 6 x weekly - 10 reps - 1-2 sets - 5 hold  Standing 'L' Stretch at Counter - 2 x daily - 6 x weekly - 10 reps - 1 sets - 5 hold

## 2019-09-18 ENCOUNTER — Telehealth (INDEPENDENT_AMBULATORY_CARE_PROVIDER_SITE_OTHER): Payer: 59 | Admitting: Cardiology

## 2019-09-18 ENCOUNTER — Encounter: Payer: Self-pay | Admitting: Cardiology

## 2019-09-18 VITALS — Wt 228.0 lb

## 2019-09-18 DIAGNOSIS — E785 Hyperlipidemia, unspecified: Secondary | ICD-10-CM | POA: Diagnosis not present

## 2019-09-18 DIAGNOSIS — Z720 Tobacco use: Secondary | ICD-10-CM

## 2019-09-18 DIAGNOSIS — I10 Essential (primary) hypertension: Secondary | ICD-10-CM | POA: Diagnosis not present

## 2019-09-18 NOTE — Progress Notes (Signed)
Virtual Visit via Telephone Note   This visit type was conducted due to national recommendations for restrictions regarding the COVID-19 Pandemic (e.g. social distancing) in an effort to limit this patient's exposure and mitigate transmission in our community.  Due to his co-morbid illnesses, this patient is at least at moderate risk for complications without adequate follow up.  This format is felt to be most appropriate for this patient at this time.  The patient did not have access to video technology/had technical difficulties with video requiring transitioning to audio format only (telephone).  All issues noted in this document were discussed and addressed.  No physical exam could be performed with this format.  Please refer to the patient's chart for his  consent to telehealth for [facility].   The patient was identified using 2 identifiers.  Date:  09/18/2019   ID:  Troy Stevens, DOB Nov 05, 1960, MRN [id number]  Patient Location: Home Provider Location: Home  PCP:  Troy Hurl, PA-C  Cardiologist:  No primary care provider on file.  Electrophysiologist:  None   Evaluation Performed:  Follow-Up Visit  Chief Complaint:  none  History of Present Illness:    Troy Stevens is a 59 y.o. male who saw Dr. Oval Linsey in 2016 for an abnormal EKG.  He has a history of hypertension, dyslipidemia, and smoking.  At that time she reviewed his EKG and did not feel any further cardiac work-up was indicated.  The patient was contacted today for routine follow-up.  He has been followed by his PCP and is on medication for his blood pressure and lipids.  His LDL in February 2021 was 79.  His blood pressure is controlled.  He has stopped smoking.  He denies any unusual chest pain or dyspnea on exertion.  The patient does not have symptoms concerning for COVID-19 infection (fever, chills, cough, or new shortness of breath).    Past Medical History:  Diagnosis Date  . Allergy   . Amputation  of thumb    left, s/p conveyer belt injury  . Arthritis   . Atopic dermatitis   . Diverticulosis    2013 colonoscopy, without diverticulitis  . Gout   . Hyperlipidemia   . Hypertension   . Xanthelasma of eyelid(374.51)    Past Surgical History:  Procedure Laterality Date  . COLONOSCOPY  01/31/2012   Sigmoid Diverticulosis, otherwise normal. Repeat 2023, Dr. Silvano Rusk  . FOREARM SURGERY     left trauma repair and skin graft s/p conveyer belt injury  . KNEE SURGERY  2001   left, arthroscopic  . MOUTH SURGERY    . 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 TENDON EXPLORATION Right 01/16/2013   Procedure: TENDON EXPLORATION AND REPAIRS RIGHT RING AND SMALL FINGERS ;  Surgeon: Wynonia Sours, MD;  Location: [facility];  Service: Orthopedics;  Laterality: Right;  . THUMB AMPUTATION       Current Meds  Medication Sig  . allopurinol (ZYLOPRIM) 300 MG tablet TAKE 1 TABLET(300 MG) BY MOUTH DAILY  . aspirin (ASPIRIN LOW DOSE) 81 MG EC tablet TAKE 1 TABLET BY MOUTH DAILY. SWALLOW WHOLE  . atorvastatin (LIPITOR) 20 MG tablet Take 1 tablet (20 mg total) by mouth daily.  . Cholecalciferol (VITAMIN D-3) 125 MCG (5000 UT) TABS 2 PO qd x 3 months, then 1 PO qd long-term after that  . gabapentin (NEURONTIN) 100 MG capsule 1 PO q HS, may increase to  1 PO TID if needed  . hydrocortisone 2.5 % cream Apply topically 2 (two) times daily.  . indomethacin (INDOCIN) 50 MG capsule TAKE ONE CAPSULE BY MOUTH THREE TIMES DAILY WITH  MEALS for gout flare  . lisinopril (ZESTRIL) 20 MG tablet Take 1 tablet (20 mg total) by mouth daily.  Marland Kitchen triamcinolone cream (KENALOG) 0.1 % Apply 1 application topically 2 (two) times daily.     Allergies:   Amlodipine   Social History   Tobacco Use  . Smoking status: Light Tobacco Smoker    Packs/day: 0.25    Years: 20.00    Pack years: 5.00    Types: Cigarettes, Cigars    Start date: 05/12/2013  . Smokeless tobacco: Never  Used  Substance Use Topics  . Alcohol use: Yes    Alcohol/week: 1.0 standard drinks    Types: 1 Cans of beer per week    Comment: 1-2 beers a week  . Drug use: Yes    Types: Marijuana    Comment: once a week last time 01/15/13     Family Hx: The patient's family history includes Arthritis in his brother; Cancer in his sister; Diabetes in his maternal grandfather; Gout in his brother; Heart disease in his brother and sister; Hyperlipidemia in his paternal aunt; Hypertension in his brother, brother, and sister; Kidney disease in his brother; Other in his father and mother; Stroke in his paternal grandfather. There is no history of Colon cancer or Stomach cancer.  ROS:   Please see the history of present illness.    Gout All other systems reviewed and are negative.   Prior CV studies:   The following studies were reviewed today:   Labs/Other Tests and Data Reviewed:    EKG:  No ECG reviewed.  Recent Labs: 07/15/2019: ALT 32; BUN 21; Creatinine, Ser 1.13; Hemoglobin 15.3; Platelets 298; Potassium 4.7; Sodium 138   Recent Lipid Panel Lab Results  Component Value Date/Time   CHOL 140 07/15/2019 10:13 AM   TRIG 90 07/15/2019 10:13 AM   HDL 44 07/15/2019 10:13 AM   CHOLHDL 3.2 07/15/2019 10:13 AM   CHOLHDL 4.7 05/12/2016 07:53 AM   LDLCALC 79 07/15/2019 10:13 AM    Wt Readings from Last 3 Encounters:  09/18/19 228 lb (103.4 kg)  08/23/19 242 lb 6.4 oz (110 kg)  07/15/19 245 lb 3.2 oz (111.2 kg)     Objective:    Vital Signs:  Wt 228 lb (103.4 kg)   BMI 34.67 kg/m    VITAL SIGNS:  reviewed  ASSESSMENT & PLAN:    HTN- Controlled  HLD- Controlled on statin rx  Smoker- He has quit  COVID-19 Education: The signs and symptoms of COVID-19 were discussed with the patient and how to seek care for testing (follow up with PCP or arrange E-visit).  The importance of social distancing was discussed today.  Time:   Today, I have spent 10 minutes with the patient with  telehealth technology discussing the above problems.     Medication Adjustments/Labs and Tests Ordered: Current medicines are reviewed at length with the patient today.  Concerns regarding medicines are outlined above.   Tests Ordered: No orders of the defined types were placed in this encounter.   Medication Changes: No orders of the defined types were placed in this encounter.   Follow Up:  Either In Person or Virtual prn  Signed, Kerin Ransom, PA-C  09/18/2019 4:03 PM    [facility]

## 2019-09-18 NOTE — Patient Instructions (Addendum)
Medication Instructions:  Your physician recommends that you continue on your current medications as directed. Please refer to the Current Medication list given to you today.  *If you need a refill on your cardiac medications before your next appointment, please call your pharmacy*   Lab Work: none If you have labs (blood work) drawn today and your tests are completely normal, you will receive your results only by: Marland Kitchen MyChart Message (if you have MyChart) OR . A paper copy in the mail If you have any lab test that is abnormal or we need to change your treatment, we will call you to review the results.   Testing/Procedures: none   Follow-Up: At [facility], you and your health needs are our priority.  As part of our continuing mission to provide you with exceptional heart care, we have created designated Provider Care Teams.  These Care Teams include your primary Cardiologist (physician) and Advanced Practice Providers (APPs -  Physician Assistants and Nurse Practitioners) who all work together to provide you with the care you need, when you need it.  Your physician recommends that you schedule a follow-up appointment in: as needed.

## 2020-03-19 ENCOUNTER — Ambulatory Visit (INDEPENDENT_AMBULATORY_CARE_PROVIDER_SITE_OTHER): Payer: 59 | Admitting: Medical

## 2020-03-19 ENCOUNTER — Encounter: Payer: Self-pay | Admitting: Medical

## 2020-03-19 ENCOUNTER — Other Ambulatory Visit: Payer: Self-pay

## 2020-03-19 VITALS — BP 150/88 | HR 61 | Ht 68.0 in | Wt 237.2 lb

## 2020-03-19 DIAGNOSIS — M545 Low back pain, unspecified: Secondary | ICD-10-CM | POA: Diagnosis not present

## 2020-03-19 DIAGNOSIS — M549 Dorsalgia, unspecified: Secondary | ICD-10-CM | POA: Diagnosis not present

## 2020-03-19 DIAGNOSIS — M62838 Other muscle spasm: Secondary | ICD-10-CM | POA: Diagnosis not present

## 2020-03-19 DIAGNOSIS — Z23 Encounter for immunization: Secondary | ICD-10-CM

## 2020-03-19 MED ORDER — HYDROCODONE-ACETAMINOPHEN 5-325 MG PO TABS: 1.0000 | ORAL_TABLET | Freq: Four times a day (QID) | ORAL | 0 refills | Status: DC | PRN

## 2020-03-19 MED ORDER — METHOCARBAMOL 500 MG PO TABS: 500.0000 mg | ORAL_TABLET | Freq: Four times a day (QID) | ORAL | 0 refills | Status: DC

## 2020-03-19 NOTE — Progress Notes (Addendum)
Established patient visit   Patient: Troy Stevens   DOB: 10/04/1960   59 y.o. Male  MRN: [id number] Visit Date: 03/19/2020  Today's healthcare provider: Dorothea Ogle, PA-C   Chief Complaint  Patient presents with  . Back Pain    left side   I,Garnett Rekowski acting as a Education administrator for [employer], PA-C.,have documented all relevant documentation on the behalf of Dorothea Ogle, PA-C,as directed by  [employer], PA-C while in the presence of [employer], PA-C.  Subjective    HPI HPI    Back Pain     Additional comments: left side        Last edited by [name], CMA on 03/19/2020  9:32 AM. (History)      Back Pain Patient presents today for mid back pain and reports wife noticed a little swelling on the left lower back. Patient reports lifting 5 lbs pails at work, but has not being to work since Friday. He denies any fever, blood in urine, leg pain, numbness, tingling, falls. He reports having normal bowel movements. Patient reports taking tylenol arthritis & pain and heating pads.    Patient reports pain when bending but not twisting.  When lower back was examined in office noticed some swelling in the left lower Lumbar region.    No other aggravating or relieving factors. No other complaint.   Medications: Outpatient Medications Prior to Visit  Medication Sig  . allopurinol (ZYLOPRIM) 300 MG tablet TAKE 1 TABLET(300 MG) BY MOUTH DAILY  . aspirin (ASPIRIN LOW DOSE) 81 MG EC tablet TAKE 1 TABLET BY MOUTH DAILY. SWALLOW WHOLE  . atorvastatin (LIPITOR) 20 MG tablet Take 1 tablet (20 mg total) by mouth daily.  . Cholecalciferol (VITAMIN D-3) 125 MCG (5000 UT) TABS 2 PO qd x 3 months, then 1 PO qd long-term after that  . lisinopril (ZESTRIL) 20 MG tablet Take 1 tablet (20 mg total) by mouth daily.  . colchicine 0.6 MG tablet Take 1 tablet (0.6 mg total) by mouth 2 (two) times daily as needed.  . gabapentin (NEURONTIN) 100 MG capsule 1 PO q HS, may increase to  1 PO TID if needed (Patient not taking: Reported on 03/19/2020)  . hydrocortisone 2.5 % cream Apply topically 2 (two) times daily. (Patient not taking: Reported on 03/19/2020)  . indomethacin (INDOCIN) 50 MG capsule TAKE ONE CAPSULE BY MOUTH THREE TIMES DAILY WITH  MEALS for gout flare (Patient not taking: Reported on 03/19/2020)  . triamcinolone cream (KENALOG) 0.1 % Apply 1 application topically 2 (two) times daily. (Patient not taking: Reported on 03/19/2020)   No facility-administered medications prior to visit.    Review of Systems  As in subjective    Objective    BP (!) 150/88   Pulse 61   Ht 5\' 8"  (1.727 m)   Wt 237 lb 3.2 oz (107.6 kg)   SpO2 96%   BMI 36.07 kg/m    Physical Exam  General well-developed well-nourished no acute distress Abdomen nontender, no mass no organomegaly, no hernia Legs nontender, hips normal range of motion without pain, no deformity Legs without swelling Legs neurovascularly intact Tender in lower mid and upper lumbar paraspinal region with positive spasm, mild localized swelling of the left paraspinal muscles in the same region there is slight asymmetry of the back creases as well suggesting some scoliosis, range of motion fairly full but with mild pain      Assessment & Plan   Encounter Diagnoses  Name  Primary?  . Mid back pain Yes  . Acute bilateral low back pain without sciatica   . Need for influenza vaccination   . Muscle spasm        Back pain, muscle spasm -begin muscle x-ray as needed, pain medicine as needed, caution with sedation, advised a few days of rest then begin stretching and consider massage.  Can use heat therapy.  No heavy lifting for now.  Wrote work note out today and tomorrow and no lifting over 25 pounds for the next 5 days  Counseled on the influenza virus vaccine.  Vaccine information sheet given.  Influenza vaccine given after consent obtained.  [facility] was seen today for back pain.  Diagnoses and all orders  for this visit:  Mid back pain -     DG THORACOLUMABAR SPINE; Future  Acute bilateral low back pain without sciatica -     DG THORACOLUMABAR SPINE; Future  Need for influenza vaccination  Muscle spasm  Other orders -     Flu Vaccine QUAD 6+ mos PF IM (Fluarix Quad PF) -     methocarbamol (ROBAXIN) 500 MG tablet; Take 1 tablet (500 mg total) by mouth 4 (four) times daily. -     HYDROcodone-acetaminophen (NORCO) 5-325 MG tablet; Take 1 tablet by mouth every 6 (six) hours as needed.    Dorothea Ogle, PA-C  [facility] [phone] (phone) [phone] (fax)  [place]

## 2020-03-20 ENCOUNTER — Ambulatory Visit
Admission: RE | Admit: 2020-03-20 | Discharge: 2020-03-20 | Disposition: A | Discharge status: Home / Self Care | Payer: 59 | Source: Ambulatory Visit | Attending: Medical | Admitting: Medical

## 2020-03-20 DIAGNOSIS — M545 Low back pain, unspecified: Secondary | ICD-10-CM

## 2020-03-20 DIAGNOSIS — M549 Dorsalgia, unspecified: Secondary | ICD-10-CM

## 2020-04-06 ENCOUNTER — Other Ambulatory Visit: Payer: Self-pay

## 2020-04-06 ENCOUNTER — Encounter: Payer: Self-pay | Admitting: Medical

## 2020-04-06 ENCOUNTER — Ambulatory Visit (INDEPENDENT_AMBULATORY_CARE_PROVIDER_SITE_OTHER): Payer: 59 | Admitting: Medical

## 2020-04-06 VITALS — BP 114/80 | HR 69 | Ht 68.0 in | Wt 241.6 lb

## 2020-04-06 DIAGNOSIS — Z720 Tobacco use: Secondary | ICD-10-CM

## 2020-04-06 DIAGNOSIS — M51369 Other intervertebral disc degeneration, lumbar region without mention of lumbar back pain or lower extremity pain: Secondary | ICD-10-CM | POA: Insufficient documentation

## 2020-04-06 DIAGNOSIS — E785 Hyperlipidemia, unspecified: Secondary | ICD-10-CM | POA: Diagnosis not present

## 2020-04-06 DIAGNOSIS — I10 Essential (primary) hypertension: Secondary | ICD-10-CM

## 2020-04-06 DIAGNOSIS — M545 Low back pain, unspecified: Secondary | ICD-10-CM

## 2020-04-06 DIAGNOSIS — I739 Peripheral vascular disease, unspecified: Secondary | ICD-10-CM

## 2020-04-06 DIAGNOSIS — M5136 Other intervertebral disc degeneration, lumbar region: Secondary | ICD-10-CM | POA: Diagnosis not present

## 2020-04-06 DIAGNOSIS — Z6836 Body mass index (BMI) 36.0-36.9, adult: Secondary | ICD-10-CM

## 2020-04-06 NOTE — Progress Notes (Signed)
Subjective:  Troy Stevens is a 59 y.o. male who presents for Chief Complaint  Patient presents with  . Back Pain     Here for f/u on back pain since 03/19/20 visit for back pain and strain.  Since last visit he feels about 80% improvement in his back.  He still has a little bit of aches.  He did use the muscle relaxer and pain medicine.  He used stretching and rest.  He actually started walking in the mornings for exercise and started doing some stretching.  He plans to continue doing this.  He and his wife are trying to do low-carb eating and trying to eat no later than 7 in the evening to lose weight.  Otherwise he has no other complaints.  No other aggravating or relieving factors.    No other c/o.  Past Medical History:  Diagnosis Date  . Allergy   . Amputation of thumb    left, s/p conveyer belt injury  . Arthritis   . Atopic dermatitis   . Diverticulosis    2013 colonoscopy, without diverticulitis  . Gout   . Hyperlipidemia   . Hypertension   . Xanthelasma of eyelid(374.51)    Past Surgical History:  Procedure Laterality Date  . COLONOSCOPY  01/31/2012   Sigmoid Diverticulosis, otherwise normal. Repeat 2023, Dr. Silvano Stevens  . FOREARM SURGERY     left trauma repair and skin graft s/p conveyer belt injury  . KNEE SURGERY  2001   left, arthroscopic  . MOUTH SURGERY    . 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 TENDON EXPLORATION Right 01/16/2013   Procedure: TENDON EXPLORATION AND REPAIRS RIGHT RING AND SMALL FINGERS ;  Surgeon: Wynonia Sours, MD;  Location: [facility];  Service: Orthopedics;  Laterality: Right;  . THUMB AMPUTATION       The following portions of the patient's history were reviewed and updated as appropriate: allergies, current medications, past family history, past medical history, past social history, past surgical history and problem list.  ROS Otherwise as in subjective above  Objective: BP 114/80    Pulse 69   Ht 5\' 8"  (1.727 m)   Wt 241 lb 9.6 oz (109.6 kg)   SpO2 94%   BMI 36.74 kg/m   General appearance: alert, no distress, well developed, well nourished Back range of motion much improved today, nontender today Walking much better today than last visit without pain Lower extremity pulses 1-2+, mild varicose veins of lower extremities Ext: no edema   Assessment: Encounter Diagnoses  Name Primary?  . DDD (degenerative disc disease), lumbar Yes  . Bilateral low back pain without sciatica, unspecified chronicity   . Peripheral vascular disease ([place])   . Hyperlipidemia, unspecified hyperlipidemia type   . Tobacco use   . Essential hypertension   . BMI 36.0-36.9,adult      Plan: I reviewed his x-rays from last visit and his symptoms.  He is much improved.  His x-rays show degenerative changes.  We discussed daily stretching or walking.  We discussed core exercises to strengthen the back and keep it strong.  Prevention is key.  I demonstrated the stretches and exercises.  He will work on losing weight through [employer], regular exercise and trying some intermittent fasting as well  The x-ray findings also showed peripheral vascular disease in the aortoiliac arteries.  We discussed this finding.  He had ABI studies done in March 2021  that were normal except for the great toes.  I advise he needs to quit smoking, continue statin and continue with walking program.  Dyslipidemia and PVD-continue statin, walking, quit smoking and quit aspirin for now.  New data out advises the need to stop aspirin in primary prevention  Hypertension-continue current medication  Troy Stevens was seen today for back pain.  Diagnoses and all orders for this visit:  DDD (degenerative disc disease), lumbar  Bilateral low back pain without sciatica, unspecified chronicity  Peripheral vascular disease ([place])  Hyperlipidemia, unspecified hyperlipidemia type  Tobacco use  Essential  hypertension  BMI 36.0-36.9,adult    Follow up: February 2022 for physical

## 2020-04-06 NOTE — Patient Instructions (Signed)
Recommendations:  Your x-ray did show some cholesterol buildup in some your arteries going into your legs  This is called peripheral vascular disease  I recommend you continue your cholesterol medicine atorvastatin Lipitor regularly at bedtime  Stop the aspirin for now due to some recent research suggesting this could do more harm than good  Try to limit or decrease your animal product intake such as things made with butter and margarine, decreased meat portions in general  You had a blood flow study of your legs back in March 2021 which was normal other than decreased blood flow to your big toes    Keep your back strong and stay healthy  Keep up the daily walking such as 45 to 60 minutes daily   do a head to toe stretching routine every day as we discussed  Try to do those for back exercise and core strengthening exercises at least 1-2 times per week  Try to eat a low-carb diet and limit your meat and carbs servings  Try not to eat no later than 7 PM  Drink plenty of water daily  Work on losing weight to take some pressure off the back which reduces wear and tear on the back

## 2020-07-02 ENCOUNTER — Encounter: Payer: Self-pay | Admitting: Medical

## 2020-07-02 ENCOUNTER — Other Ambulatory Visit: Payer: Self-pay

## 2020-07-02 ENCOUNTER — Ambulatory Visit (INDEPENDENT_AMBULATORY_CARE_PROVIDER_SITE_OTHER): Payer: 59 | Admitting: Medical

## 2020-07-02 VITALS — BP 150/80 | HR 83 | Ht 68.0 in | Wt 241.0 lb

## 2020-07-02 DIAGNOSIS — M1000 Idiopathic gout, unspecified site: Secondary | ICD-10-CM

## 2020-07-02 DIAGNOSIS — M2550 Pain in unspecified joint: Secondary | ICD-10-CM

## 2020-07-02 DIAGNOSIS — M2540 Effusion, unspecified joint: Secondary | ICD-10-CM | POA: Diagnosis not present

## 2020-07-02 DIAGNOSIS — M1A9XX1 Chronic gout, unspecified, with tophus (tophi): Secondary | ICD-10-CM

## 2020-07-02 DIAGNOSIS — M2560 Stiffness of unspecified joint, not elsewhere classified: Secondary | ICD-10-CM | POA: Diagnosis not present

## 2020-07-02 DIAGNOSIS — Z8261 Family history of arthritis: Secondary | ICD-10-CM

## 2020-07-02 DIAGNOSIS — Z720 Tobacco use: Secondary | ICD-10-CM

## 2020-07-02 MED ORDER — COLCHICINE 0.6 MG PO TABS: 0.6000 mg | ORAL_TABLET | Freq: Two times a day (BID) | ORAL | 2 refills | Status: DC | PRN

## 2020-07-02 MED ORDER — ATORVASTATIN CALCIUM 20 MG PO TABS: 20.0000 mg | ORAL_TABLET | Freq: Every day | ORAL | 1 refills | Status: DC

## 2020-07-02 MED ORDER — LISINOPRIL 20 MG PO TABS: 20.0000 mg | ORAL_TABLET | Freq: Every day | ORAL | 1 refills | Status: DC

## 2020-07-02 MED ORDER — INDOMETHACIN 50 MG PO CAPS: ORAL_CAPSULE | ORAL | 3 refills | Status: DC

## 2020-07-02 MED ORDER — PREDNISONE 10 MG PO TABS: ORAL_TABLET | ORAL | 0 refills | Status: DC

## 2020-07-02 MED ORDER — ALLOPURINOL 300 MG PO TABS: ORAL_TABLET | ORAL | 1 refills | Status: DC

## 2020-07-02 MED ORDER — HYDROCODONE-ACETAMINOPHEN 5-325 MG PO TABS
1.0000 | ORAL_TABLET | Freq: Four times a day (QID) | ORAL | 0 refills | Status: DC | PRN
Start: 2020-07-02 — End: 2021-10-22

## 2020-07-02 NOTE — Patient Instructions (Addendum)
You are having an arthritis flare  You have a history of gout  We are going to check some additional blood work today for markers for inflammation and the uric acid   Recommendations:  Begin prednisone steroid 10 mg, 6 pills today, 5 pills Friday, 4 pills Saturday, 3 pills Sunday, 2 pills Monday, 1 pill Tuesday  During the time that you are on prednisone, stop the Indocin so you do not overlap similar medications  Continue colchicine twice daily for the next week or more if needed  Continue your allopurinol daily medicine to help prevent gout flareup  Over the weekend if you have worse pain you can use the hydrocodone pain medicine every now and then as needed  I refilled colchicine and Indocin today so you do not run out  We are going to refer you to a rheumatologist for other evaluation and discussion of your joint issues  You had negative rheumatoid labs last year but you have joint swelling and joint stiffness and family history of arthritis, thus I think he should have a consult with a rheumatologist  During the time that you are using colchicine, do not take your cholesterol medicine briefly until you finish the round of colchicine as these 2 medications can interact  Drink tart cherry juice over-the-counter when you have a flareup like this   Make sure you are drinking plenty of fluids  If you do not see improvement over the weekend by Monday or Tuesday then let me know

## 2020-07-02 NOTE — Progress Notes (Signed)
Subjective:  Troy Stevens is a 60 y.o. male who presents for Chief Complaint  Patient presents with  . Joint Swelling     Here for joint issues.   A week and half ago wasn't feeling good.  Later that day both knees were stiff, ankles stiff, wrist stiff and swollen, fingers stiff and swollen  Started colchicine 4 days ago.  Started indocin as well. Still having lots of pains in multiple joints, ankles, wrists, elbows, fingers, knees, and decreased ROM in hands, knees and fingers bilat.   2 of his brothers gets bad arthritis with bad joint swelling.  Been drinking plenty of water, no recent ETOH use.  In general with family hx/o notes sister with history of Troy Stevens and sister with ALS.  No other aggravating or relieving factors.    No other c/o.  Past Medical History:  Diagnosis Date  . Allergy   . Amputation of thumb    left, s/p conveyer belt injury  . Arthritis   . Atopic dermatitis   . Diverticulosis    2013 colonoscopy, without diverticulitis  . Gout   . Hyperlipidemia   . Hypertension   . Xanthelasma of eyelid(374.51)    Current Outpatient Medications on File Prior to Visit  Medication Sig Dispense Refill  . allopurinol (ZYLOPRIM) 300 MG tablet TAKE 1 TABLET(300 MG) BY MOUTH DAILY 90 tablet 3  . atorvastatin (LIPITOR) 20 MG tablet Take 1 tablet (20 mg total) by mouth daily. 90 tablet 3  . Cholecalciferol (VITAMIN D-3) 125 MCG (5000 UT) TABS 2 PO qd x 3 months, then 1 PO qd long-term after that 180 tablet 3  . lisinopril (ZESTRIL) 20 MG tablet Take 1 tablet (20 mg total) by mouth daily. 90 tablet 3  . gabapentin (NEURONTIN) 100 MG capsule 1 PO q HS, may increase to 1 PO TID if needed (Patient not taking: No sig reported) 90 capsule 3  . hydrocortisone 2.5 % cream Apply topically 2 (two) times daily. (Patient not taking: No sig reported) 30 g 0  . methocarbamol (ROBAXIN) 500 MG tablet Take 1 tablet (500 mg total) by mouth 4 (four) times daily. (Patient not taking:  Reported on 07/02/2020) 15 tablet 0  . triamcinolone cream (KENALOG) 0.1 % Apply 1 application topically 2 (two) times daily. (Patient not taking: No sig reported) 80 g 1   No current facility-administered medications on file prior to visit.   Family History  Problem Relation Age of Onset  . Other Mother        died in [place]  . Other Father        died in [place]  . Arthritis Brother   . Hypertension Brother   . Hypertension Sister   . Stroke Paternal Grandfather   . Diabetes Maternal Grandfather   . Heart disease Sister   . Hyperlipidemia Paternal Aunt   . Cancer Sister        cancer  . Gout Brother   . Hypertension Brother   . Heart disease Brother   . Kidney disease Brother   . Colon cancer Neg Hx   . Stomach cancer Neg Hx      The following portions of the patient's history were reviewed and updated as appropriate: allergies, current medications, past family history, past medical history, past social history, past surgical history and problem list.  ROS Otherwise as in subjective above  Objective: BP (!) 150/80   Pulse 83   Ht 5\' 8"  (1.727 m)   Wt  241 lb (109.3 kg)   SpO2 98%   BMI 36.64 kg/m   General appearance: alert, no distress, well developed, well nourished Neck: supple, no lymphadenopathy, no thyromegaly, no masses Heart: RRR, normal S1, S2, no murmurs Lungs: CTA bilaterally, no wheezes, rhonchi, or rales Abdomen: +bs, soft, non tender, non distended, no masses, no hepatomegaly, no splenomegaly Pulses: 2+ radial pulses, 2+ pedal pulses, normal cap refill Ext: no edema Tender over bilat wrists and there is reduced flexion and extension of right wrist, left 2nd and 3rd phalanges with swelling of the PIP and MCPs, similar on right with 2-4 th fingers, tender bilat knees, no obvious swelling but mildly reduced ROM of knees, tophi present left knee, tophi present bilat elbows, tender over bilat elbows, tender ankles bilat with moderate reduced  ROM     Assessment: Encounter Diagnoses  Name Primary?  . Polyarthralgia Yes  . Family history of arthritis   . Joint stiffness   . Joint swelling   . Idiopathic gout, unspecified chronicity, unspecified site   . Tophi   . Current occasional smoker      Plan: Discussed his symptoms. He had negative rheumatoid screen labs last year, but has joint swelling, known gout and tophi, and flare up currently.  referral to rheum. Discussed recommendations below  Patient Instructions  You are having an arthritis flare  You have a history of gout  We are going to check some additional blood work today for markers for inflammation and the uric acid   Recommendations:  Begin prednisone steroid 10 mg, 6 pills today, 5 pills Friday, 4 pills Saturday, 3 pills Sunday, 2 pills Monday, 1 pill Tuesday  During the time that you are on prednisone, stop the Indocin so you do not overlap similar medications  Continue colchicine twice daily for the next week or more if needed  Continue your allopurinol daily medicine to help prevent gout flareup  Over the weekend if you have worse pain you can use the hydrocodone pain medicine every now and then as needed  I refilled colchicine and Indocin today so you do not run out  We are going to refer you to a rheumatologist for other evaluation and discussion of your joint issues  You had negative rheumatoid labs last year but you have joint swelling and joint stiffness and family history of arthritis, thus I think he should have a consult with a rheumatologist  During the time that you are using colchicine, do not take your cholesterol medicine briefly until you finish the round of colchicine as these 2 medications can interact  Drink tart cherry juice over-the-counter when you have a flareup like this   Make sure you are drinking plenty of fluids  If you do not see improvement over the weekend by Monday or Tuesday then let me know      Troy Stevens  was seen today for joint swelling.  Diagnoses and all orders for this visit:  Polyarthralgia -     Sedimentation rate -     Uric acid -     Comprehensive metabolic panel -     Ambulatory referral to Rheumatology  Family history of arthritis -     Sedimentation rate -     Uric acid -     Comprehensive metabolic panel -     Ambulatory referral to Rheumatology  Joint stiffness -     Sedimentation rate -     Uric acid -     Comprehensive metabolic panel -  Ambulatory referral to Rheumatology  Joint swelling -     Sedimentation rate -     Uric acid -     Comprehensive metabolic panel -     Ambulatory referral to Rheumatology  Idiopathic gout, unspecified chronicity, unspecified site -     Sedimentation rate -     Uric acid -     Comprehensive metabolic panel -     Ambulatory referral to Rheumatology  Tophi -     Sedimentation rate -     Uric acid -     Comprehensive metabolic panel -     Ambulatory referral to Rheumatology  Current occasional smoker  Other orders -     indomethacin (INDOCIN) 50 MG capsule; TAKE ONE CAPSULE BY MOUTH THREE TIMES DAILY WITH  MEALS for gout flare -     colchicine 0.6 MG tablet; Take 1 tablet (0.6 mg total) by mouth 2 (two) times daily as needed. -     HYDROcodone-acetaminophen (NORCO) 5-325 MG tablet; Take 1 tablet by mouth every 6 (six) hours as needed. -     predniSONE (DELTASONE) 10 MG tablet; 6/5/4/3/2/1 taper    Follow up: pending labs and referral

## 2020-07-02 NOTE — Progress Notes (Signed)
Referral has been sent.

## 2020-07-03 LAB — COMPREHENSIVE METABOLIC PANEL
ALT: 59 IU/L — ABNORMAL HIGH (ref 0–44)
AST: 25 IU/L (ref 0–40)
Albumin/Globulin Ratio: 1.8 (ref 1.2–2.2)
Albumin: 4.2 g/dL (ref 3.8–4.9)
Alkaline Phosphatase: 78 IU/L (ref 44–121)
BUN/Creatinine Ratio: 11 (ref 9–20)
BUN: 12 mg/dL (ref 6–24)
Bilirubin Total: 0.3 mg/dL (ref 0.0–1.2)
CO2: 23 mmol/L (ref 20–29)
Calcium: 9.1 mg/dL (ref 8.7–10.2)
Chloride: 103 mmol/L (ref 96–106)
Creatinine, Ser: 1.05 mg/dL (ref 0.76–1.27)
GFR calc Af Amer: 89 mL/min/{1.73_m2} (ref >59)
GFR calc non Af Amer: 77 mL/min/{1.73_m2} (ref >59)
Globulin, Total: 2.4 g/dL (ref 1.5–4.5)
Glucose: 108 mg/dL — ABNORMAL HIGH (ref 65–99)
Potassium: 4.8 mmol/L (ref 3.5–5.2)
Sodium: 140 mmol/L (ref 134–144)
Total Protein: 6.6 g/dL (ref 6.0–8.5)

## 2020-07-03 LAB — SEDIMENTATION RATE: Sed Rate: 25 mm/hr (ref 0–30)

## 2020-07-03 LAB — URIC ACID: Uric Acid: 3.9 mg/dL (ref 3.8–8.4)

## 2020-07-03 NOTE — Progress Notes (Signed)
Note for work type & emailed to pt

## 2020-07-06 NOTE — Progress Notes (Signed)
Hey, This is the one we spoke on.  Were you able to complete?

## 2020-07-08 ENCOUNTER — Ambulatory Visit: Payer: Self-pay

## 2020-07-08 ENCOUNTER — Encounter: Payer: Self-pay | Admitting: Internal Medicine

## 2020-07-08 ENCOUNTER — Other Ambulatory Visit: Payer: Self-pay

## 2020-07-08 ENCOUNTER — Ambulatory Visit (INDEPENDENT_AMBULATORY_CARE_PROVIDER_SITE_OTHER): Payer: 59 | Admitting: Internal Medicine

## 2020-07-08 VITALS — BP 148/94 | HR 82 | Ht 67.25 in | Wt 238.0 lb

## 2020-07-08 DIAGNOSIS — M79641 Pain in right hand: Secondary | ICD-10-CM | POA: Diagnosis not present

## 2020-07-08 DIAGNOSIS — M1000 Idiopathic gout, unspecified site: Secondary | ICD-10-CM

## 2020-07-08 DIAGNOSIS — M79672 Pain in left foot: Secondary | ICD-10-CM | POA: Diagnosis not present

## 2020-07-08 DIAGNOSIS — M545 Low back pain, unspecified: Secondary | ICD-10-CM | POA: Diagnosis not present

## 2020-07-08 DIAGNOSIS — M79671 Pain in right foot: Secondary | ICD-10-CM

## 2020-07-08 DIAGNOSIS — G8929 Other chronic pain: Secondary | ICD-10-CM

## 2020-07-08 DIAGNOSIS — M2550 Pain in unspecified joint: Secondary | ICD-10-CM

## 2020-07-08 DIAGNOSIS — M2540 Effusion, unspecified joint: Secondary | ICD-10-CM | POA: Diagnosis not present

## 2020-07-08 DIAGNOSIS — M79642 Pain in left hand: Secondary | ICD-10-CM

## 2020-07-08 NOTE — Progress Notes (Signed)
Office Visit Note  Patient: Troy Stevens             Date of Birth: 07/16/1960           MRN: [id number]             PCP: Carlena Hurl, PA-C Referring: Carlena Hurl, PA-C Visit Date: 07/08/2020  Subjective:  New Patient (Initial Visit), Joint Pain (Bilateral wrists, bilateral ankles, bilateral knees, neck, and back), and Gout (Patient feels as if gout is well controlled on current regimen. )  History of Present Illness: Troy Stevens is a 60 y.o. male with a history of eczema, peripheral vascular disease, degenerative lumbar disease, prostate hypertrophy, and remote left thumb amputation here for evaluation of tophaceous gout and inflammatory arthritis. He has a longstanding history of gout but this was well controlled he has not experienced any of his typical flares for the past year. He takes allopurinol 352m daily and colchicine and prednisone for acute attacks. Since about 4 months ago he started having persistent and worsening pain in the wrists and ankles especially, also in neck and back and knees are worse. He was prescribed a course of prednisone from his PCP office and this has improved symptoms of pain and swelling almost completely, he has tapered this and took his last dose yesterday.   Labs reviewed 06/2020 Uric acid 3.9 ESR 25 CMP ALT 59  07/2019 RF neg CCP neg Vit D 13.6   Activities of Daily Living:  Patient reports morning stiffness for 1-24 hours.   Patient Reports nocturnal pain.  Difficulty dressing/grooming: Reports Difficulty climbing stairs: Reports Difficulty getting out of chair: Reports Difficulty using hands for taps, buttons, cutlery, and/or writing: Reports  Review of Systems  Constitutional: Negative for fatigue.  HENT: Negative for mouth sores, mouth dryness and nose dryness.   Eyes: Negative for pain, itching, visual disturbance and dryness.  Respiratory: Negative for cough, hemoptysis, shortness of breath and difficulty breathing.    Cardiovascular: Positive for swelling in legs/feet. Negative for chest pain and palpitations.  Gastrointestinal: Negative for abdominal pain, blood in stool, constipation and diarrhea.  Endocrine: Negative for increased urination.  Genitourinary: Negative for painful urination.  Musculoskeletal: Positive for arthralgias, joint pain, joint swelling, myalgias, morning stiffness and myalgias. Negative for muscle weakness and muscle tenderness.  Skin: Negative for color change, rash and redness.  Allergic/Immunologic: Negative for susceptible to infections.  Neurological: Positive for numbness. Negative for dizziness, headaches, memory loss and weakness.  Hematological: Negative for swollen glands.  Psychiatric/Behavioral: Positive for sleep disturbance. Negative for confusion.    PMFS History:  Patient Active Problem List   Diagnosis Date Noted  . Tophi 07/02/2020  . Family history of arthritis 07/02/2020  . Joint stiffness 07/02/2020  . Joint swelling 07/02/2020  . Current occasional smoker 07/02/2020  . Peripheral vascular disease ([facility] 04/06/2020  . Bilateral low back pain without sciatica 04/06/2020  . DDD (degenerative disc disease), lumbar 04/06/2020  . BMI 36.0-36.9,adult 04/06/2020  . Paresthesia of both lower extremities 07/15/2019  . Chronic pain of right ankle 07/15/2019  . 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 Tobacco use 07/15/2019  . Hemorrhoids 07/15/2019  . Prostate enlargement 07/15/2019  . Trigger finger of right thumb 11/22/2017  . Routine general medical examination at a health care facility 07/12/2017  . Encounter for hepatitis C screening test for low risk patient 07/12/2017  . Xanthelasma 05/12/2016  . Essential hypertension 03/02/2015  . Encounter for health maintenance examination  in adult 03/02/2015  . Smoker 03/02/2015  . Vaccine counseling 03/02/2015  . Eczema 03/02/2015  . Need for prophylactic vaccination and inoculation against  influenza 03/02/2015  . Gout 01/04/2012  . ED (erectile dysfunction) 01/04/2012  . Hyperlipidemia 12/13/2011  . Obesity 12/13/2011  . Polyarthralgia 12/13/2011    Past Medical History:  Diagnosis Date  . Allergy   . Amputation of thumb    left, s/p conveyer belt injury  . Arthritis   . Atopic dermatitis   . Diverticulosis    2013 colonoscopy, without diverticulitis  . Gout   . Hyperlipidemia   . Hypertension   . 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 Other Mother        died in [place]  . Other Father        died in [place]  . Arthritis Brother   . Hypertension Brother   . Hypertension Sister   . Stroke Paternal Grandfather   . Diabetes Maternal Grandfather   . Heart disease Sister   . Hyperlipidemia Paternal Aunt   . Cancer Sister        cancer  . Gout Brother   . Hypertension Brother   . Heart disease Brother   . Kidney disease Brother   . Colon cancer Neg Hx   . Stomach cancer Neg Hx    Past Surgical History:  Procedure Laterality Date  . COLONOSCOPY  01/31/2012   Sigmoid Diverticulosis, otherwise normal. Repeat 2023, Dr. Silvano Rusk  . FOREARM SURGERY     left trauma repair and skin graft s/p conveyer belt injury  . KNEE SURGERY  2001   left, arthroscopic  . MOUTH SURGERY    . 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 TENDON EXPLORATION Right 01/16/2013   Procedure: TENDON EXPLORATION AND REPAIRS RIGHT RING AND SMALL FINGERS ;  Surgeon: Wynonia Sours, MD;  Location: [facility];  Service: Orthopedics;  Laterality: Right;  . THUMB AMPUTATION     Social History   Social History Narrative   Married, 2 children, exercise - 10,000 steps daily at work.   Working at [employer], pulls orders for paint, and does deliveries.  Walks on the job.  06/2019         Immunization History  Administered Date(s) Administered  . Influenza,inj,Quad PF,6+ Mos 03/02/2015, 05/12/2016, 03/21/2017,  05/07/2018, 04/08/2019, 03/19/2020  . Moderna Sars-Covid-2 Vaccination 09/19/2019, 10/17/2019  . Pneumococcal Polysaccharide-23 03/02/2015  . Tdap 12/13/2011     Objective: Vital Signs: BP (!) 148/94 (BP Location: Right Arm, Patient Position: Sitting, Cuff Size: Normal)   Pulse 82   Ht 5' 7.25" (1.708 m)   Wt 238 lb (108 kg)   BMI 37.00 kg/m    Physical Exam Constitutional:      Appearance: He is obese.  HENT:     Right Ear: External ear normal.     Left Ear: External ear normal.     Mouth/Throat:     Mouth: Mucous membranes are moist.     Pharynx: Oropharynx is clear.  Cardiovascular:     Rate and Rhythm: Normal rate and regular rhythm.  Pulmonary:     Effort: Pulmonary effort is normal.     Comments: Faint basilar inspiratory crackles Skin:    General: Skin is warm and dry.     Findings: No rash.  Neurological:     General: No focal  deficit present.     Mental Status: He is alert.  Psychiatric:        Mood and Affect: Mood normal.     Musculoskeletal Exam:  Neck full range of motion, some tenderness in right cervical paraspinal muscles Shoulders full range of motion mild right-sided tenderness with the abduction Elbows palpable mobile nodules and olecranon bursae bilaterally without tenderness and normal range of motion Right wrist tenderness and reduced range of motion with pain on flexion and extension mild swelling, left wrist normal Hands normal range of motion and grip strength bilaterally, right side pain with base of the thumb without significant synovitis or MCP joint involvement, left hand missing thumb remaining digits full range of motion no swelling Lumbar paraspinal muscle tenderness to palpation extending laterally above the superior crest Normal hip internal and external rotation without pain Knees with patellofemoral crepitus bilaterally mild left side tenderness good range of motion Ankles severely restricted plantar dorsiflexion with mildly  restricted inversion and eversion no significant joint swelling MTPs full range of motion no tenderness or swelling   Investigation: No additional findings.  Imaging: XR Foot 2 Views Left  Result Date: 07/08/2020 X-ray left foot 2 views Tibiotalar joint space appears intact although anterior posterior osteophytes present.  Very mild midfoot peripheral osteophytes and joint space is normal.  MCP and toe joint spaces normal.  Posterior calcaneal enthesophyte present.  No significant joint effusion or soft tissue swelling seen. 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  Talar midfoot joints osteophytes present.  MTPs tender joints appear normal.  Posterior blood vessel calcification seen. Impression Mild midfoot arthritis and severe tibiotalar joint arthritis, no significant joint effusion or soft tissue swelling  XR Hand 2 View Left  Result Date: 07/08/2020 X-ray left hand 2 views Radiocarpal joint space appears normal. Carpal bones normal except trapezium amputated and 1st digit absent. MCP, PIP, and DIP joints appear normal. Normal bone mineralization and no soft tissue swelling seen. Impression 1st digit absent no inflammatory arthritis changes seen.  XR Hand 2 View Right  Result Date: 07/08/2020 X-ray right hand 2 views Radiocarpal joint space narrowing.  Ulnar styloid absent and some calcifications of adjacent space.  Numerous cystic and some probably erosive changes present throughout the carpal bones with decreased carpal joint spaces.  MCP and PIP joint spaces appear normal.  Few probable enthesophytes at DIP joints.  Bone mineralization appears normal.  No soft tissue swelling seen. 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  Procedures:  No procedures performed Allergies: Amlodipine   Assessment / Plan:     Visit Diagnoses: Polyarthralgia Joint swelling - Plan: XR Hand 2 View Right, XR Hand 2 View Left,  XR Foot 2 Views Right, XR Foot 2 Views Left  Joint pain multiple sites was different from his previous gout flares now seems like a more persistent inflammatory polyarthritis.  Chronic gout can develop this type of seronegative inflammatory arthritis presentation but also many alternate causes.  X-rays of hands and feet were obtained in clinic today the erosive and cystic changes in the right wrist are consistent with crystalline arthropathy. Did not see TFC chondrocalcinosis suggesting against calcium pyrophosphate deposition disease.  Ankle x-rays reveal osteoarthritis bilaterally much worse than the right probably explains the highly restricted plantar dorsiflexion with large anterior posterior osteophytes as well as joint narrowing.  Idiopathic gout, unspecified chronicity, unspecified site  Uric acid apparently very well at goal less than 4 on current allopurinol.  However recent values may be unreliable in the context of acute flare.  The mobile painless nodules in his olecranon bursae do appear to represent tophi.  Not sure how he would have progressive enlargement of these and was uric acid was not completely controlled at least sometimes during the interval.  Ideally we can retest this outside of an acute flare so we will bring back  to the clinic in 1 to 2 weeks for serum labs also possible joint aspiration.  Chronic bilateral low back pain without sciatica  Chronic bilateral back pain without sciatica peripheral numbness in the toes with use is atypical for neurogenic claudication.  Distribution of tenderness on exam is more for knee OA and lateral hip pain syndrome.  Review of previous lumbar spine imaging with anterior osteophytes consistent with degenerative disease  Orders: Orders Placed This Encounter  Procedures  . XR Hand 2 View Right  . XR Hand 2 View Left  . XR Foot 2 Views Right  . XR Foot 2 Views Left   No orders of the defined types were placed in this encounter.   Follow-Up Instructions: Return in about 10 days (around 07/18/2020) for 1-2 wks f/u for inflammation, gout.   Collier Salina, MD  Note - This record has been created using [employer].  Chart creation errors have been sought, but may not always  have been located. Such creation errors do not reflect on  the standard of medical care.

## 2020-07-16 ENCOUNTER — Encounter: Payer: Self-pay | Admitting: Medical

## 2020-07-16 ENCOUNTER — Ambulatory Visit (INDEPENDENT_AMBULATORY_CARE_PROVIDER_SITE_OTHER): Payer: 59 | Admitting: Medical

## 2020-07-16 ENCOUNTER — Other Ambulatory Visit: Payer: Self-pay

## 2020-07-16 VITALS — BP 142/92 | HR 88 | Ht 67.0 in | Wt 234.2 lb

## 2020-07-16 DIAGNOSIS — Z23 Encounter for immunization: Secondary | ICD-10-CM

## 2020-07-16 DIAGNOSIS — I739 Peripheral vascular disease, unspecified: Secondary | ICD-10-CM

## 2020-07-16 DIAGNOSIS — M1000 Idiopathic gout, unspecified site: Secondary | ICD-10-CM

## 2020-07-16 DIAGNOSIS — F17200 Nicotine dependence, unspecified, uncomplicated: Secondary | ICD-10-CM | POA: Diagnosis not present

## 2020-07-16 DIAGNOSIS — R7301 Impaired fasting glucose: Secondary | ICD-10-CM

## 2020-07-16 DIAGNOSIS — I10 Essential (primary) hypertension: Secondary | ICD-10-CM | POA: Diagnosis not present

## 2020-07-16 DIAGNOSIS — M5136 Other intervertebral disc degeneration, lumbar region: Secondary | ICD-10-CM

## 2020-07-16 DIAGNOSIS — Z8261 Family history of arthritis: Secondary | ICD-10-CM

## 2020-07-16 DIAGNOSIS — Z1211 Encounter for screening for malignant neoplasm of colon: Secondary | ICD-10-CM | POA: Insufficient documentation

## 2020-07-16 DIAGNOSIS — Z0000 Encounter for general adult medical examination without abnormal findings: Secondary | ICD-10-CM

## 2020-07-16 DIAGNOSIS — R748 Abnormal levels of other serum enzymes: Secondary | ICD-10-CM | POA: Insufficient documentation

## 2020-07-16 DIAGNOSIS — Z8249 Family history of ischemic heart disease and other diseases of the circulatory system: Secondary | ICD-10-CM

## 2020-07-16 DIAGNOSIS — M2550 Pain in unspecified joint: Secondary | ICD-10-CM

## 2020-07-16 DIAGNOSIS — M1A9XX1 Chronic gout, unspecified, with tophus (tophi): Secondary | ICD-10-CM

## 2020-07-16 DIAGNOSIS — Z89012 Acquired absence of left thumb: Secondary | ICD-10-CM | POA: Diagnosis not present

## 2020-07-16 DIAGNOSIS — E785 Hyperlipidemia, unspecified: Secondary | ICD-10-CM

## 2020-07-16 DIAGNOSIS — Z7185 Encounter for immunization safety counseling: Secondary | ICD-10-CM

## 2020-07-16 DIAGNOSIS — H0260 Xanthelasma of unspecified eye, unspecified eyelid: Secondary | ICD-10-CM

## 2020-07-16 DIAGNOSIS — N528 Other male erectile dysfunction: Secondary | ICD-10-CM

## 2020-07-16 DIAGNOSIS — N400 Enlarged prostate without lower urinary tract symptoms: Secondary | ICD-10-CM

## 2020-07-16 DIAGNOSIS — Z125 Encounter for screening for malignant neoplasm of prostate: Secondary | ICD-10-CM

## 2020-07-16 DIAGNOSIS — H18413 Arcus senilis, bilateral: Secondary | ICD-10-CM | POA: Insufficient documentation

## 2020-07-16 LAB — POCT URINALYSIS DIP (PROADVANTAGE DEVICE)
Bilirubin, UA: NEGATIVE
Blood, UA: NEGATIVE
Glucose, UA: NEGATIVE mg/dL
Ketones, POC UA: NEGATIVE mg/dL
Leukocytes, UA: NEGATIVE
Nitrite, UA: NEGATIVE
Protein Ur, POC: NEGATIVE mg/dL
Specific Gravity, Urine: 1.02
Urobilinogen, Ur: 0.2
pH, UA: 6 (ref 5.0–8.0)

## 2020-07-16 NOTE — Progress Notes (Signed)
Subjective:   HPI  Troy Stevens is a 60 y.o. male who presents for Chief Complaint  Patient presents with  . Annual Exam    Physical with fasting labs     Patient Care Team: Emilia Kayes, Camelia Eng, PA-C as PCP - General (Family Medicine) Sees dentist Sees eye doctor Dr. Vernelle Emerald, rheumatology Dr. Eunice Blase, orthopedics Dr. Skeet Latch and Kerin Ransom, PA-C with cardiology Dr. Silvano Rusk, GI  Concerns: Saw rheumatology for consult recently, has f/u planned  otherwise in usual state of health   Reviewed their medical, surgical, family, social, medication, and allergy history and updated chart as appropriate.  Past Medical History:  Diagnosis Date  . Allergy   . Amputation of thumb    left, s/p conveyer belt injury  . Arthritis   . Atopic dermatitis   . Diverticulosis    2013 colonoscopy, without diverticulitis  . Gout   . Hyperlipidemia   . Hypertension   . 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 COLONOSCOPY  01/31/2012   Sigmoid Diverticulosis, otherwise normal. Repeat 2023, Dr. Silvano Rusk  . FOREARM SURGERY     left trauma repair and skin graft s/p conveyer belt injury  . KNEE SURGERY  2001   left, arthroscopic  . MOUTH SURGERY    . 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 TENDON EXPLORATION Right 01/16/2013   Procedure: TENDON EXPLORATION AND REPAIRS RIGHT RING AND SMALL FINGERS ;  Surgeon: Wynonia Sours, MD;  Location: [facility];  Service: Orthopedics;  Laterality: Right;  . THUMB AMPUTATION      Family History  Problem Relation Age of Onset  . Other Mother        died in [place]  . Other Father        died in [place]  . Arthritis Brother   . Hypertension Brother   . Hypertension Sister   . Stroke Paternal Grandfather   . Diabetes Maternal Grandfather   . Heart disease Sister   . Hyperlipidemia Paternal Aunt   . Cancer Sister        cancer  .  Gout Brother   . Hypertension Brother   . Heart disease Brother   . Kidney disease Brother   . Colon cancer Neg Hx   . Stomach cancer Neg Hx      Current Outpatient Medications:  .  allopurinol (ZYLOPRIM) 300 MG tablet, TAKE 1 TABLET(300 MG) BY MOUTH DAILY, Disp: 90 tablet, Rfl: 1 .  colchicine 0.6 MG tablet, Take 1 tablet (0.6 mg total) by mouth 2 (two) times daily as needed., Disp: 30 tablet, Rfl: 2 .  lisinopril (ZESTRIL) 20 MG tablet, Take 1 tablet (20 mg total) by mouth daily., Disp: 90 tablet, Rfl: 1 .  rosuvastatin (CRESTOR) 20 MG tablet, Take 1 tablet (20 mg total) by mouth daily., Disp: 90 tablet, Rfl: 3 .  Cholecalciferol (VITAMIN D-3) 125 MCG (5000 UT) TABS, Take 1 tablet by mouth daily., Disp: 90 tablet, Rfl: 3 .  gabapentin (NEURONTIN) 100 MG capsule, Take 2 capsules (200 mg total) by mouth at bedtime., Disp: 180 capsule, Rfl: 0 .  HYDROcodone-acetaminophen (NORCO) 5-325 MG tablet, Take 1 tablet by mouth every 6 (six) hours as needed. (Patient not taking: Reported on 07/16/2020), Disp: 20 tablet, Rfl: 0 .  hydrocortisone 2.5 % cream, Apply topically 2 (two) times daily. (Patient not taking: Reported on 07/16/2020),  Disp: 30 g, Rfl: 0 .  indomethacin (INDOCIN) 50 MG capsule, TAKE ONE CAPSULE BY MOUTH THREE TIMES DAILY WITH  MEALS for gout flare (Patient not taking: No sig reported), Disp: 60 capsule, Rfl: 3 .  triamcinolone cream (KENALOG) 0.1 %, Apply 1 application topically 2 (two) times daily. (Patient not taking: No sig reported), Disp: 80 g, Rfl: 1  Allergies  Allergen Reactions  . Amlodipine     Ankle edema       Review of Systems Constitutional: -fever, -chills, -sweats, -unexpected weight change, -decreased appetite, -fatigue Allergy: -sneezing, -itching, -congestion Dermatology: -changing moles, --rash, -lumps ENT: -runny nose, -ear pain, -sore throat, -hoarseness, -sinus pain, -teeth pain, - ringing in ears, -hearing loss, -nosebleeds Cardiology: -chest pain,  -palpitations, -swelling, -difficulty breathing when lying flat, -waking up short of breath Respiratory: -cough, -shortness of breath, -difficulty breathing with exercise or exertion, -wheezing, -coughing up blood Gastroenterology: -abdominal pain, -nausea, -vomiting, -diarrhea, -constipation, -blood in stool, -changes in bowel movement, -difficulty swallowing or eating Hematology: -bleeding, -bruising  Musculoskeletal: +joint aches, -muscle aches, -joint swelling, -back pain, -neck pain, -cramping, -changes in gait Ophthalmology: denies vision changes, eye redness, itching, discharge Urology: -burning with urination, -difficulty urinating, -blood in urine, -urinary frequency, -urgency, -incontinence Neurology: -headache, -weakness, -tingling, -numbness, -memory loss, -falls, -dizziness Psychology: -depressed mood, -agitation, -sleep problems Male GU: no testicular mass, pain, no lymph nodes swollen, no swelling, no rash.     Objective:  BP (!) 142/92   Pulse 88   Ht 5\' 7"  (1.702 m)   Wt 234 lb 3.2 oz (106.2 kg)   SpO2 96%   BMI 36.68 kg/m   General appearance: alert, no distress, WD/WN, African American male Skin: unremarkable HEENT/oral - limited normal exam given masks and covid precautions Neck: supple, no lymphadenopathy, no thyromegaly, no masses, normal ROM, no bruits Chest: non tender, normal shape and expansion Heart: RRR, normal S1, S2, no murmurs Lungs: CTA bilaterally, no wheezes, rhonchi, or rales Abdomen: +bs, soft, non tender, non distended, no masses, no hepatomegaly, no splenomegaly, no bruits Back: non tender, normal ROM, no scoliosis Musculoskeletal: tophi of bilat elbows and knees, left upper arm without thumb and scarring up hand to forearm from prior injury, upper extremities non tender, no obvious deformity, normal ROM throughout, lower extremities non tender, no obvious deformity, normal ROM throughout Extremities: no edema, no cyanosis, no clubbing Pulses: 2+  symmetric, upper and lower extremities, normal cap refill Neurological: alert, oriented x 3, CN2-12 intact, strength normal upper extremities and lower extremities, sensation normal throughout, DTRs 2+ throughout, no cerebellar signs, gait normal Psychiatric: normal affect, behavior normal, pleasant  GU: normal male external genitalia,, nontender, no masses, no hernia, no lymphadenopathy Rectal: declined   Assessment and Plan :   Encounter Diagnoses  Name Primary?  . Routine general medical examination at a health care facility Yes  . Smoker   . Status post amputation of left thumb   . Tophi   . Vaccine counseling   . Xanthelasma   . Prostate enlargement   . Peripheral vascular disease ([facility])   . Hyperlipidemia, unspecified hyperlipidemia type   . Idiopathic gout, unspecified chronicity, unspecified site   . Family history of arthritis   . Essential hypertension   . Other male erectile dysfunction   . DDD (degenerative disc disease), lumbar   . Screening for prostate cancer   . Screen for colon cancer   . Impaired fasting blood sugar   . Elevated liver enzymes   . Need  for pneumococcal vaccination   . Arcus senilis of both eyes   . Polyarthralgia   . Family history of premature CAD     Today you had a preventative care visit or wellness visit.    Topics today may have included healthy lifestyle, diet, exercise, preventative care, vaccinations, sick and well care, proper use of emergency dept and after hours care, as well as other concerns.     Recommendations: Continue to return yearly for your annual wellness and preventative care visits.  This gives [place] a chance to discuss healthy lifestyle, exercise, vaccinations, review your chart record, and perform screenings where appropriate.  I recommend you see your eye doctor yearly for routine vision care.  I recommend you see your dentist yearly for routine dental care including hygiene visits twice yearly.   Vaccination  recommendations were reviewed  You report being up to date on flu and covid vaccine You are up to date on tetanus vaccine  Shingles vaccine:  I recommend you have a shingles vaccine to help prevent shingles or herpes zoster outbreak.   Please call your insurer to inquire about coverage for the Shingrix vaccine given in 2 doses.   Some insurers cover this vaccine after age 55, some cover this after age 43.  If your insurer covers this, then call to schedule appointment to have this vaccine here.  Counseled on the pneumococcal vaccine.  Vaccine information sheet given.  Pneumococcal vaccine PPSV23 given after consent obtained.    Screening for cancer: Colon cancer screening:  Due for repeat colonoscopy 2023  We discussed PSA, prostate exam, and prostate cancer screening risks/benefits.     Skin cancer screening: Check your skin regularly for new changes, growing lesions, or other lesions of concern Come in for evaluation if you have skin lesions of concern.  Lung cancer screening: If you have a greater than 30 pack year history of tobacco use, then you qualify for lung cancer screening with a chest CT scan  We currently don't have screenings for other cancers besides breast, cervical, colon, and lung cancers.  If you have a strong family history of cancer or have other cancer screening concerns, please let me know.    Bone health: Get at least 150 minutes of aerobic exercise weekly Get weight bearing exercise at least once weekly   Heart health: Get at least 150 minutes of aerobic exercise weekly Limit alcohol It is important to maintain a healthy blood pressure and healthy diet numbers  C/t follow up with cardiology   Separate significant issues discussed: Stop tobacco.  He declines medicaiton support  Polyarthralgia, gout, tophi-recently established with rheumatology and has follow-up soon with them.  Continue allopurinol for gout prevention and control of uric acid,  continue colchicine as needed for flareup, Norco hydrocodone sparingly as needed  Xanthelasma, hyperlipidemia, arcus senilis -continue statin, work on low-cholesterol diet  Prostate enlargement-asymptomatic, PSA monitoring today  Hypertension-continue current medication  Impaired fasting glucose-labs today  Elevated liver test-likely due to fatty liver disease.  Hepatitis C negative on February 2021,, consider ultrasound abdomen.  Low risk for hepatitis infection in general, doubt hemochromatosis.  If liver test continues to be elevated, screen for hepatitis B surface antigen and iron level as well as ultrasound.  Peripheral vascular disease, premature family history of CAD, hypertension, hyperlipidemia-continue efforts to eat healthy low-fat low-cholesterol diet, exercise, continue efforts to reduce other cardiac risk factors  Troy Stevens was seen today for annual exam.  Diagnoses and all orders for this visit:  Routine general medical examination at a health care facility -     PSA -     Hemoglobin A1c -     CBC with Differential/Platelet -     Lipid panel -     POCT Urinalysis DIP (Proadvantage Device)  Smoker  Status post amputation of left thumb  Tophi  Vaccine counseling  Xanthelasma  Prostate enlargement  Peripheral vascular disease ([facility])  Hyperlipidemia, unspecified hyperlipidemia type -     Lipid panel  Idiopathic gout, unspecified chronicity, unspecified site  Family history of arthritis  Essential hypertension -     POCT Urinalysis DIP (Proadvantage Device)  Other male erectile dysfunction  DDD (degenerative disc disease), lumbar  Screening for prostate cancer -     PSA  Screen for colon cancer  Impaired fasting blood sugar -     Hemoglobin A1c  Elevated liver enzymes  Need for pneumococcal vaccination  Arcus senilis of both eyes  Polyarthralgia  Family history of premature CAD  Other orders -     Pneumococcal polysaccharide vaccine  23-valent greater than or equal to 2yo subcutaneous/IM   Follow-up pending labs, yearly for physical

## 2020-07-17 ENCOUNTER — Other Ambulatory Visit: Payer: Self-pay | Admitting: Medical

## 2020-07-17 LAB — CBC WITH DIFFERENTIAL/PLATELET
Basophils Absolute: 0.1 10*3/uL (ref 0.0–0.2)
Basos: 1 %
EOS (ABSOLUTE): 0.3 10*3/uL (ref 0.0–0.4)
Eos: 5 %
Hematocrit: 45.5 % (ref 37.5–51.0)
Hemoglobin: 15.4 g/dL (ref 13.0–17.7)
Immature Grans (Abs): 0 10*3/uL (ref 0.0–0.1)
Immature Granulocytes: 0 %
Lymphocytes Absolute: 2 10*3/uL (ref 0.7–3.1)
Lymphs: 35 %
MCH: 31.3 pg (ref 26.6–33.0)
MCHC: 33.8 g/dL (ref 31.5–35.7)
MCV: 93 fL (ref 79–97)
Monocytes Absolute: 0.6 10*3/uL (ref 0.1–0.9)
Monocytes: 11 %
Neutrophils Absolute: 2.7 10*3/uL (ref 1.4–7.0)
Neutrophils: 48 %
Platelets: 317 10*3/uL (ref 150–450)
RBC: 4.92 x10E6/uL (ref 4.14–5.80)
RDW: 13.1 % (ref 11.6–15.4)
WBC: 5.7 10*3/uL (ref 3.4–10.8)

## 2020-07-17 LAB — LIPID PANEL
Chol/HDL Ratio: 5.2 ratio — ABNORMAL HIGH (ref 0.0–5.0)
Cholesterol, Total: 223 mg/dL — ABNORMAL HIGH (ref 100–199)
HDL: 43 mg/dL (ref >39)
LDL Chol Calc (NIH): 154 mg/dL — ABNORMAL HIGH (ref 0–99)
Triglycerides: 144 mg/dL (ref 0–149)
VLDL Cholesterol Cal: 26 mg/dL (ref 5–40)

## 2020-07-17 LAB — HEMOGLOBIN A1C
Est. average glucose Bld gHb Est-mCnc: 128 mg/dL
Hgb A1c MFr Bld: 6.1 % — ABNORMAL HIGH (ref 4.8–5.6)

## 2020-07-17 LAB — PSA: Prostate Specific Ag, Serum: 1.2 ng/mL (ref 0.0–4.0)

## 2020-07-17 MED ORDER — GABAPENTIN 100 MG PO CAPS: 200.0000 mg | ORAL_CAPSULE | Freq: Every day | ORAL | 0 refills | Status: DC

## 2020-07-17 MED ORDER — ROSUVASTATIN CALCIUM 20 MG PO TABS: 20.0000 mg | ORAL_TABLET | Freq: Every day | ORAL | 3 refills | Status: DC

## 2020-07-17 MED ORDER — VITAMIN D-3 125 MCG (5000 UT) PO TABS: 1.0000 | ORAL_TABLET | Freq: Every day | ORAL | 3 refills | Status: DC

## 2020-07-20 ENCOUNTER — Ambulatory Visit (INDEPENDENT_AMBULATORY_CARE_PROVIDER_SITE_OTHER): Payer: 59 | Admitting: Internal Medicine

## 2020-07-20 ENCOUNTER — Encounter: Payer: Self-pay | Admitting: Internal Medicine

## 2020-07-20 ENCOUNTER — Other Ambulatory Visit: Payer: Self-pay

## 2020-07-20 VITALS — BP 129/76 | HR 75 | Ht 67.5 in | Wt 241.0 lb

## 2020-07-20 DIAGNOSIS — M1A9XX1 Chronic gout, unspecified, with tophus (tophi): Secondary | ICD-10-CM

## 2020-07-20 DIAGNOSIS — E559 Vitamin D deficiency, unspecified: Secondary | ICD-10-CM | POA: Diagnosis not present

## 2020-07-20 NOTE — Progress Notes (Signed)
Office Visit Note  Patient: Troy Stevens             Date of Birth: 04/17/1961           MRN: [id number]             PCP: Carlena Hurl, PA-C Referring: Carlena Hurl, PA-C Visit Date: 07/20/2020   Subjective:  Follow-up (Patient complains of bilateral hip pain, worse with increased activity/walking. Patient also complains of bilateral hand pain and soreness. )   History of Present Illness: [name] is a 60 y.o. male here for follow up of chronic tophaceous gout and arthritis. At last visit he had recently completed steroid treatment with acute flare so recommended to return for lab workup in 2 wks also possible joint aspiration if effusions persist or worsen. Xrays demonstrated cystic and erosive changes in the wrist most consistent with a crystalline arthropathy. Since the last visit symptoms are not much changed. He has joint pain and stiffness mostly without swelling or warmth at this time.    Labs reviewed 06/2020 Uric acid 3.9 ESR 25 CMP ALT 59  07/2019 RF neg CCP neg Vit D 13.6  Review of Systems  Constitutional: Negative for fatigue.  HENT: Negative for mouth sores, mouth dryness and nose dryness.   Eyes: Negative for pain, itching, visual disturbance and dryness.  Respiratory: Negative for cough, hemoptysis, shortness of breath and difficulty breathing.   Cardiovascular: Negative for chest pain, palpitations and swelling in legs/feet.  Gastrointestinal: Negative for abdominal pain, blood in stool, constipation and diarrhea.  Endocrine: Negative for increased urination.  Genitourinary: Negative for painful urination.  Musculoskeletal: Positive for arthralgias, joint pain and morning stiffness. Negative for joint swelling, myalgias, muscle weakness, muscle tenderness and myalgias.  Skin: Negative for color change, rash and redness.  Allergic/Immunologic: Negative for susceptible to infections.  Neurological: Negative for dizziness, numbness, headaches,  memory loss and weakness.  Hematological: Negative for swollen glands.  Psychiatric/Behavioral: Positive for sleep disturbance. Negative for confusion.    PMFS History:  Patient Active Problem List   Diagnosis Date Noted  . Vitamin D deficiency 07/20/2020  . Screening for prostate cancer 07/16/2020  . Screen for colon cancer 07/16/2020  . Elevated liver enzymes 07/16/2020  . Impaired fasting blood sugar 07/16/2020  . Need for pneumococcal vaccination 07/16/2020  . Arcus senilis of both eyes 07/16/2020  . Family history of premature CAD 07/16/2020  . Joint stiffness 07/02/2020  . Joint swelling 07/02/2020  . Current occasional smoker 07/02/2020  . Peripheral vascular disease ([place]) 04/06/2020  . Bilateral low back pain without sciatica 04/06/2020  . DDD (degenerative disc disease), lumbar 04/06/2020  . BMI 36.0-36.9,adult 04/06/2020  . Paresthesia of both lower extremities 07/15/2019  . Chronic pain of right ankle 07/15/2019  . 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 Hemorrhoids 07/15/2019  . Prostate enlargement 07/15/2019  . Trigger finger of right thumb 11/22/2017  . Routine general medical examination at a health care facility 07/12/2017  . Encounter for hepatitis C screening test for low risk patient 07/12/2017  . Xanthelasma 05/12/2016  . Essential hypertension 03/02/2015  . Encounter for health maintenance examination in adult 03/02/2015  . Smoker 03/02/2015  . Vaccine counseling 03/02/2015  . Eczema 03/02/2015  . Need for prophylactic vaccination and inoculation against influenza 03/02/2015  . Chronic tophaceous gout 01/04/2012  . ED (erectile dysfunction) 01/04/2012  . Hyperlipidemia 12/13/2011  . Obesity 12/13/2011  . Polyarthralgia 12/13/2011    Past Medical History:  Diagnosis Date  . Allergy   . Amputation of thumb    left, s/p conveyer belt injury  . Arthritis   . Atopic dermatitis   . Diverticulosis    2013 colonoscopy, without  diverticulitis  . Gout   . Hyperlipidemia   . Hypertension   . 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 Other Mother        died in [place]  . Other Father        died in [place]  . Arthritis Brother   . Hypertension Brother   . Hypertension Sister   . Stroke Paternal Grandfather   . Diabetes Maternal Grandfather   . Heart disease Sister   . Hyperlipidemia Paternal Aunt   . Cancer Sister        cancer  . Gout Brother   . Hypertension Brother   . Heart disease Brother   . Kidney disease Brother   . Colon cancer Neg Hx   . Stomach cancer Neg Hx    Past Surgical History:  Procedure Laterality Date  . COLONOSCOPY  01/31/2012   Sigmoid Diverticulosis, otherwise normal. Repeat 2023, Dr. Silvano Rusk  . FOREARM SURGERY     left trauma repair and skin graft s/p conveyer belt injury  . KNEE SURGERY  2001   left, arthroscopic  . MOUTH SURGERY    . 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 TENDON EXPLORATION Right 01/16/2013   Procedure: TENDON EXPLORATION AND REPAIRS RIGHT RING AND SMALL FINGERS ;  Surgeon: Wynonia Sours, MD;  Location: [facility];  Service: Orthopedics;  Laterality: Right;  . THUMB AMPUTATION     Social History   Social History Narrative   Married, 2 children, exercise - 10,000 steps daily at work.   Working at [employer], pulls orders for paint, and does deliveries.  Walks on the job.  06/2019         Immunization History  Administered Date(s) Administered  . Influenza,inj,Quad PF,6+ Mos 03/02/2015, 05/12/2016, 03/21/2017, 05/07/2018, 04/08/2019, 03/19/2020  . Moderna Sars-Covid-2 Vaccination 09/19/2019, 10/17/2019, 06/09/2020  . Pneumococcal Polysaccharide-23 03/02/2015, 07/16/2020  . Tdap 12/13/2011     Objective: Vital Signs: BP 129/76 (BP Location: Left Arm, Patient Position: Sitting, Cuff Size: Normal)   Pulse 75   Ht 5' 7.5" (1.715 m)   Wt 241 lb (109.3 kg)   BMI  37.19 kg/m    Physical Exam Constitutional:      Appearance: He is obese.  HENT:     Right Ear: External ear normal.     Left Ear: External ear normal.  Eyes:     Conjunctiva/sclera: Conjunctivae normal.  Skin:    General: Skin is warm and dry.     Findings: No rash.  Neurological:     General: No focal deficit present.     Mental Status: He is alert.  Psychiatric:        Mood and Affect: Mood normal.     Musculoskeletal Exam:  Shoulders full range of motion mild right-sided tenderness with the abduction Elbows palpable mobile nodules and olecranon bursae bilaterally without tenderness and normal range of motion Right wrist tenderness and reduced range of motion with pain on flexion and extension, bony prominence on dorsum of hand, left wrist normal Fingers normal range of motion and grip strength bilaterally some right thumb base tenderness without swelling, left hand thumb absent  rest digits normal Knees with patellofemoral crepitus bilaterally, full ROM Ankles with b/l restricted plantar and dorsiflexion with mildly restricted inversion and eversion no significant joint swelling   Investigation: No additional findings.  Imaging: XR Foot 2 Views Left  Result Date: 07/08/2020 X-ray left foot 2 views Tibiotalar joint space appears intact although anterior posterior osteophytes present.  Very mild midfoot peripheral osteophytes and joint space is normal.  MCP and toe joint spaces normal.  Posterior calcaneal enthesophyte present.  No significant joint effusion or soft tissue swelling seen. 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  Talar midfoot joints osteophytes present.  MTPs tender joints appear normal.  Posterior blood vessel calcification seen. Impression Mild midfoot arthritis and severe  tibiotalar joint arthritis, no significant joint effusion or soft tissue swelling  XR Hand 2 View Left  Result Date: 07/08/2020 X-ray left hand 2 views Radiocarpal joint space appears normal. Carpal bones normal except trapezium amputated and 1st digit absent. MCP, PIP, and DIP joints appear normal. Normal bone mineralization and no soft tissue swelling seen. Impression 1st digit absent no inflammatory arthritis changes seen.  XR Hand 2 View Right  Result Date: 07/08/2020 X-ray right hand 2 views Radiocarpal joint space narrowing.  Ulnar styloid absent and some calcifications of adjacent space.  Numerous cystic and some probably erosive changes present throughout the carpal bones with decreased carpal joint spaces.  MCP and PIP joint spaces appear normal.  Few probable enthesophytes at DIP joints.  Bone mineralization appears normal.  No soft tissue swelling seen. 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  Procedures:  No procedures performed Allergies: Amlodipine   Assessment / Plan:     Visit Diagnoses: Chronic tophaceous gout - Plan: Sedimentation rate, Uric acid  Findings deftly consistent with chronic tophaceous gout however well-controlled uric acid is unclear be having continued attacks.  Currently not in a flare he has joint pain numerous sites without inflammatory changes on exam today.  Could possibly represent  residual/secondary osteoarthritis.  Plan to recheck uric acid now outside of flare as well as sedimentation rate.  If these look normal going to recommend we just follow-up in either a few months or when next has flare joint aspiration would be beneficial to better rule out alternate inflammatory arthritis process.  Vitamin D deficiency - Plan: VITAMIN D 25 Hydroxy (Vit-D Deficiency, Fractures)  History of decreased vitamin D and repeated glucocorticoid exposure though would benefit from vitamin D supplementation if below goal today.  Last checked greater than 6 months ago was low at 13.6 at that time.  Orders: Orders Placed This Encounter  Procedures  . Sedimentation rate  . Uric acid  . VITAMIN D 25  Hydroxy (Vit-D Deficiency, Fractures)   No orders of the defined types were placed in this encounter.   Follow-Up Instructions: No follow-ups on file.   Collier Salina, MD  Note - This record has been created using [employer].  Chart creation errors have been sought, but may not always  have been located. Such creation errors do not reflect on  the standard of medical care.

## 2020-07-21 LAB — URIC ACID: Uric Acid, Serum: 5.7 mg/dL (ref 4.0–8.0)

## 2020-07-21 LAB — VITAMIN D 25 HYDROXY (VIT D DEFICIENCY, FRACTURES): Vit D, 25-Hydroxy: 31 ng/mL (ref 30–100)

## 2020-07-21 LAB — SEDIMENTATION RATE: Sed Rate: 2 mm/h (ref 0–20)

## 2020-07-31 NOTE — Progress Notes (Signed)
Current uric acid level is at goal so no dose adjustment of gout medications at this time. I would like to see him back when he next experiences a flare up, he can call the office to schedule. Otherwise, we can follow up to recheck this in about 6 months.

## 2020-10-19 ENCOUNTER — Other Ambulatory Visit: Payer: Self-pay | Admitting: Medical

## 2020-11-17 ENCOUNTER — Other Ambulatory Visit: Payer: Self-pay | Admitting: Medical

## 2020-11-23 ENCOUNTER — Encounter: Payer: Self-pay | Admitting: Medical

## 2020-11-23 ENCOUNTER — Ambulatory Visit (INDEPENDENT_AMBULATORY_CARE_PROVIDER_SITE_OTHER): Payer: 59 | Admitting: Medical

## 2020-11-23 VITALS — BP 120/80 | HR 78 | Ht 67.5 in | Wt 244.2 lb

## 2020-11-23 DIAGNOSIS — R748 Abnormal levels of other serum enzymes: Secondary | ICD-10-CM

## 2020-11-23 DIAGNOSIS — Z23 Encounter for immunization: Secondary | ICD-10-CM | POA: Insufficient documentation

## 2020-11-23 DIAGNOSIS — R202 Paresthesia of skin: Secondary | ICD-10-CM

## 2020-11-23 DIAGNOSIS — Z7185 Encounter for immunization safety counseling: Secondary | ICD-10-CM

## 2020-11-23 DIAGNOSIS — Z6837 Body mass index (BMI) 37.0-37.9, adult: Secondary | ICD-10-CM | POA: Diagnosis not present

## 2020-11-23 DIAGNOSIS — R7301 Impaired fasting glucose: Secondary | ICD-10-CM

## 2020-11-23 DIAGNOSIS — M2550 Pain in unspecified joint: Secondary | ICD-10-CM

## 2020-11-23 DIAGNOSIS — E785 Hyperlipidemia, unspecified: Secondary | ICD-10-CM | POA: Diagnosis not present

## 2020-11-23 DIAGNOSIS — F17200 Nicotine dependence, unspecified, uncomplicated: Secondary | ICD-10-CM

## 2020-11-23 NOTE — Progress Notes (Signed)
Subjective:  Troy Stevens is a 60 y.o. male who presents for Chief Complaint  Patient presents with   Follow-up    Follow up on labs and wants shingles vaccine     Here for recheck.  At his last visit which is a physical, his cholesterol is not at goal.  We changed from Lipitor to Crestor for better efficacy.  Here for recheck on this today fasting.  No side effects reported medication  He has not been taking gabapentin since last visit as his pain has really been calmed down.  He notes no recent paresthesias.  We had actually increased the gabapentin last visit but surprisingly he stopped this due to improvements overall  Gout-he is seeing rheumatology currently from recent referral this year  Elevated liver test last visit that he is not drinking alcohol regularly.  As of today last alcohol was probably a month ago.  No concern for hepatitis exposure  Has cut back a lot on tobacco since last visit but still smoking some.  No other aggravating or relieving factors.    No other c/o.  The following portions of the patient's history were reviewed and updated as appropriate: allergies, current medications, past family history, past medical history, past social history, past surgical history and problem list.  ROS Otherwise as in subjective above  Objective: BP 120/80   Pulse 78   Ht 5' 7.5" (1.715 m)   Wt 244 lb 3.2 oz (110.8 kg)   SpO2 95%   BMI 37.68 kg/m   General appearance: alert, no distress, well developed, well nourished Nontender, positive bowel sounds, soft, no mass, no organomegaly Heart regular rate and rhythm, normal S1, S2, no murmurs Lungs clear   Assessment: Encounter Diagnoses  Name Primary?   Hyperlipidemia, unspecified hyperlipidemia type Yes   Need for shingles vaccine    Elevated liver enzymes    BMI 37.0-37.9, adult    Impaired fasting blood sugar    Vaccine counseling    Polyarthralgia    Paresthesia of both lower extremities    Smoker       Plan: Hyperlipidemia-last visit we changed to Crestor for better efficacy.  Labs today for surveillance.  Tolerating this fine.  Elevated liver test-no recent alcohol use,.  His liver tests are mildly elevated in February.  I suspect this is due to fatty liver disease.  Recheck lipids today  Impaired glucose, prediabetes-recheck fasting labs today.  Discussed healthy low sugar diet, regular exercise, trying to avoid progression to diabetes  BMI greater than 37-same recommendations as prediabetes, lose weight, exercise regularly, increase walking and steps  Polyarthralgia, paresthesias-recently improved with regard to overall pain and paresthesias.  He is seeing rheumatology now.  No recent gout flares.  He recently quit gabapentin as he has not really been in pain  Counseled on the Shingrix vaccine.  Vaccine information sheet given. Shingrix #1 vaccine given after consent obtained.   Return in 2 months for Shingrix #2.    Troy Stevens was seen today for follow-up.  Diagnoses and all orders for this visit:  Hyperlipidemia, unspecified hyperlipidemia type -     Comprehensive metabolic panel -     Lipid panel  Need for shingles vaccine  Elevated liver enzymes -     Comprehensive metabolic panel  BMI 81.0-17.5, adult  Impaired fasting blood sugar  Vaccine counseling  Polyarthralgia  Paresthesia of both lower extremities  Smoker  Other orders -     Varicella-zoster vaccine IM (Shingrix)   Follow up:  for yearly physical

## 2020-11-24 ENCOUNTER — Other Ambulatory Visit: Payer: Self-pay

## 2020-11-24 LAB — COMPREHENSIVE METABOLIC PANEL
ALT: 32 IU/L (ref 0–44)
AST: 21 IU/L (ref 0–40)
Albumin/Globulin Ratio: 1.5 (ref 1.2–2.2)
Albumin: 4.5 g/dL (ref 3.8–4.9)
Alkaline Phosphatase: 32 IU/L — ABNORMAL LOW (ref 44–121)
BUN/Creatinine Ratio: 16 (ref 10–24)
BUN: 17 mg/dL (ref 8–27)
Bilirubin Total: 0.2 mg/dL (ref 0.0–1.2)
CO2: 29 mmol/L (ref 20–29)
Calcium: 9 mg/dL (ref 8.6–10.2)
Chloride: 103 mmol/L (ref 96–106)
Creatinine, Ser: 1.08 mg/dL (ref 0.76–1.27)
Globulin, Total: 3.1 g/dL (ref 1.5–4.5)
Glucose: 129 mg/dL — ABNORMAL HIGH (ref 65–99)
Potassium: 4.5 mmol/L (ref 3.5–5.2)
Sodium: 135 mmol/L (ref 134–144)
Total Protein: 7.6 g/dL (ref 6.0–8.5)
eGFR: 79 mL/min/{1.73_m2} (ref >59)

## 2020-11-24 LAB — LIPID PANEL
Chol/HDL Ratio: 3.6 ratio (ref 0.0–5.0)
Cholesterol, Total: 142 mg/dL (ref 100–199)
HDL: 39 mg/dL — ABNORMAL LOW (ref >39)
LDL Chol Calc (NIH): 84 mg/dL (ref 0–99)
Triglycerides: 101 mg/dL (ref 0–149)
VLDL Cholesterol Cal: 19 mg/dL (ref 5–40)

## 2020-11-24 MED ORDER — ALLOPURINOL 300 MG PO TABS: ORAL_TABLET | ORAL | 3 refills | Status: DC

## 2020-11-24 MED ORDER — LISINOPRIL 20 MG PO TABS
20.0000 mg | ORAL_TABLET | Freq: Every day | ORAL | 3 refills | Status: DC
Start: 2020-11-24 — End: 2021-10-25

## 2021-03-05 ENCOUNTER — Other Ambulatory Visit (INDEPENDENT_AMBULATORY_CARE_PROVIDER_SITE_OTHER): Payer: 59

## 2021-03-05 ENCOUNTER — Other Ambulatory Visit: Payer: Self-pay

## 2021-03-05 DIAGNOSIS — Z23 Encounter for immunization: Secondary | ICD-10-CM | POA: Diagnosis not present

## 2021-07-19 ENCOUNTER — Encounter: Payer: 59 | Admitting: Medical

## 2021-08-16 ENCOUNTER — Ambulatory Visit (INDEPENDENT_AMBULATORY_CARE_PROVIDER_SITE_OTHER): Payer: 59

## 2021-08-16 DIAGNOSIS — Z23 Encounter for immunization: Secondary | ICD-10-CM

## 2021-10-16 ENCOUNTER — Other Ambulatory Visit: Payer: Self-pay | Admitting: Medical

## 2021-10-18 ENCOUNTER — Other Ambulatory Visit: Payer: Self-pay | Admitting: Medical

## 2021-10-18 NOTE — Telephone Encounter (Signed)
Has upcoming appointment next week ?

## 2021-10-22 ENCOUNTER — Encounter: Payer: Self-pay | Admitting: Medical

## 2021-10-22 ENCOUNTER — Ambulatory Visit (INDEPENDENT_AMBULATORY_CARE_PROVIDER_SITE_OTHER): Payer: 59 | Admitting: Medical

## 2021-10-22 ENCOUNTER — Ambulatory Visit
Admission: RE | Admit: 2021-10-22 | Discharge: 2021-10-22 | Disposition: A | Discharge status: Home / Self Care | Payer: 59 | Source: Ambulatory Visit | Attending: Medical | Admitting: Medical

## 2021-10-22 VITALS — BP 138/80 | HR 68 | Ht 68.5 in | Wt 242.6 lb

## 2021-10-22 DIAGNOSIS — Z125 Encounter for screening for malignant neoplasm of prostate: Secondary | ICD-10-CM

## 2021-10-22 DIAGNOSIS — Z0000 Encounter for general adult medical examination without abnormal findings: Secondary | ICD-10-CM | POA: Diagnosis not present

## 2021-10-22 DIAGNOSIS — I739 Peripheral vascular disease, unspecified: Secondary | ICD-10-CM

## 2021-10-22 DIAGNOSIS — Z1211 Encounter for screening for malignant neoplasm of colon: Secondary | ICD-10-CM

## 2021-10-22 DIAGNOSIS — R7301 Impaired fasting glucose: Secondary | ICD-10-CM

## 2021-10-22 DIAGNOSIS — M25571 Pain in right ankle and joints of right foot: Secondary | ICD-10-CM

## 2021-10-22 DIAGNOSIS — M51369 Other intervertebral disc degeneration, lumbar region without mention of lumbar back pain or lower extremity pain: Secondary | ICD-10-CM

## 2021-10-22 DIAGNOSIS — K649 Unspecified hemorrhoids: Secondary | ICD-10-CM

## 2021-10-22 DIAGNOSIS — E785 Hyperlipidemia, unspecified: Secondary | ICD-10-CM

## 2021-10-22 DIAGNOSIS — Z89012 Acquired absence of left thumb: Secondary | ICD-10-CM

## 2021-10-22 DIAGNOSIS — E559 Vitamin D deficiency, unspecified: Secondary | ICD-10-CM

## 2021-10-22 DIAGNOSIS — Z720 Tobacco use: Secondary | ICD-10-CM | POA: Diagnosis not present

## 2021-10-22 DIAGNOSIS — M2550 Pain in unspecified joint: Secondary | ICD-10-CM

## 2021-10-22 DIAGNOSIS — G8929 Other chronic pain: Secondary | ICD-10-CM

## 2021-10-22 DIAGNOSIS — Z8249 Family history of ischemic heart disease and other diseases of the circulatory system: Secondary | ICD-10-CM

## 2021-10-22 DIAGNOSIS — M1A9XX1 Chronic gout, unspecified, with tophus (tophi): Secondary | ICD-10-CM

## 2021-10-22 DIAGNOSIS — M25619 Stiffness of unspecified shoulder, not elsewhere classified: Secondary | ICD-10-CM

## 2021-10-22 DIAGNOSIS — I10 Essential (primary) hypertension: Secondary | ICD-10-CM

## 2021-10-22 DIAGNOSIS — Z7185 Encounter for immunization safety counseling: Secondary | ICD-10-CM

## 2021-10-22 DIAGNOSIS — N528 Other male erectile dysfunction: Secondary | ICD-10-CM

## 2021-10-22 DIAGNOSIS — Z23 Encounter for immunization: Secondary | ICD-10-CM

## 2021-10-22 DIAGNOSIS — H0260 Xanthelasma of unspecified eye, unspecified eyelid: Secondary | ICD-10-CM

## 2021-10-22 DIAGNOSIS — M25511 Pain in right shoulder: Secondary | ICD-10-CM

## 2021-10-22 DIAGNOSIS — N400 Enlarged prostate without lower urinary tract symptoms: Secondary | ICD-10-CM

## 2021-10-22 DIAGNOSIS — M5136 Other intervertebral disc degeneration, lumbar region: Secondary | ICD-10-CM

## 2021-10-22 DIAGNOSIS — R202 Paresthesia of skin: Secondary | ICD-10-CM

## 2021-10-22 DIAGNOSIS — Z6836 Body mass index (BMI) 36.0-36.9, adult: Secondary | ICD-10-CM

## 2021-10-22 DIAGNOSIS — H18413 Arcus senilis, bilateral: Secondary | ICD-10-CM

## 2021-10-22 MED ORDER — HYDROCORTISONE 2.5 % EX CREA: TOPICAL_CREAM | Freq: Two times a day (BID) | CUTANEOUS | 2 refills | Status: DC

## 2021-10-22 NOTE — Assessment & Plan Note (Signed)
Normal ABI 2021.  Continue exercise as usual ?

## 2021-10-22 NOTE — Assessment & Plan Note (Signed)
Needs to work harder to quit smoking completely ?

## 2021-10-22 NOTE — Assessment & Plan Note (Signed)
Continue current medication, goal is less than 120/70.  Work on losing weight, eating a low-salt healthy diet ?

## 2021-10-22 NOTE — Assessment & Plan Note (Signed)
Updated labs today ?

## 2021-10-22 NOTE — Assessment & Plan Note (Signed)
Work on efforts to lose weight through healthy diet and exercise ?

## 2021-10-22 NOTE — Assessment & Plan Note (Signed)
He is compliant with statin ?

## 2021-10-22 NOTE — Assessment & Plan Note (Signed)
Advised hot bath soaps, can use hydrocortisone cream as needed for flareup.  We discussed prevention, avoiding constipation and frequent toileting ?

## 2021-10-22 NOTE — Assessment & Plan Note (Signed)
Continue cholesterol medication, labs today, work on low-cholesterol diet and stopping tobacco ?

## 2021-10-22 NOTE — Patient Instructions (Addendum)
Please go to [facility] for your shoulder xray.   Their hours are 8am - 4:30 pm Monday - Friday.  Take your insurance card with you. ? ?[facility] ?[phone] ? ?[address], [address] ?[place], [place] [address] ? ?[place] [address] ?[place], [place] [address]  ? ? ?Begin Hydrocortisone cream topically twice daily for a few days at a time for hemorrhoid flare up.  Also use 20 minute hot bath soaks ? ? ? ?Today you had a preventative care visit or wellness visit.    Topics today may have included healthy lifestyle, diet, exercise, preventative care, vaccinations, sick and well care, proper use of emergency dept and after hours care, as well as other concerns.   ? ? ?Recommendations: ?Continue to return yearly for your annual wellness and preventative care visits.  This gives [place] a chance to discuss healthy lifestyle, exercise, vaccinations, review your chart record, and perform screenings where appropriate. ? ?I recommend you see your eye doctor yearly for routine vision care. ? ?I recommend you see your dentist yearly for routine dental care including hygiene visits twice yearly. ? ? ?Vaccination recommendations were reviewed ? ?Counseled on the Td (tetanus, diptheria) vaccine.  Vaccine information sheet given. Td vaccine given after consent obtained. ? ? ?Screening for cancer: ?Colon cancer screening:  ?Due for repeat colonoscopy 2023.  Referral placed ? ?We discussed PSA, prostate exam, and prostate cancer screening risks/benefits.    ? ?Skin cancer screening: ?Check your skin regularly for new changes, growing lesions, or other lesions of concern ?Come in for evaluation if you have skin lesions of concern. ? ?Lung cancer screening: ?If you have a greater than 30 pack year history of tobacco use, then you qualify for lung cancer screening with a chest CT scan ? ?We currently don't have screenings for other cancers besides breast, cervical, colon, and lung cancers.  If you have a strong family  history of cancer or have other cancer screening concerns, please let me know.  ? ? ?Bone health: ?Get at least 150 minutes of aerobic exercise weekly ?Get weight bearing exercise at least once weekly ? ? ?Heart health: ?Get at least 150 minutes of aerobic exercise weekly ?Limit alcohol ?It is important to maintain a healthy blood pressure and healthy diet numbers ? ?C/t follow up with cardiology ? ?We discussed pursuing a CT coronary score.  He will let me know ? ?

## 2021-10-22 NOTE — Assessment & Plan Note (Signed)
Go for x-ray.  I suspect some mild adhesive capsulitis versus arthritis.  We will likely refer to physical therapy ?

## 2021-10-22 NOTE — Progress Notes (Signed)
Subjective:  ? ?HPI ? Troy Stevens is a 61 y.o. male who presents for ?Chief Complaint  ?Patient presents with  ? fasting cpe  ?  Fasting cpe, no concerns  ? ? ?Patient Care Team: ?Jessup Ogas, Leward Quan as PCP - General (Family Medicine) ?Skeet Latch, MD as Attending Physician (Cardiology) ?Daryll Brod, MD as Consulting Physician (Orthopedic Surgery) ?Collier Salina, MD as Consulting Physician (Rheumatology) ?Rosemary Holms, DPM as Consulting Physician (Podiatry) ?Gatha Mayer, MD as Consulting Physician (Gastroenterology) ? ?Concerns: ?Every now and then having some pains in right shoulder.   Sleeps on that side a lot.  Raising arm up over 90 degrees can aggravate the pain. ? ?Having some hemorrhoid flare up.  Having 2-3 BM daily.   Sometimes stool is soft or loose, sometimes hard.    Using preparation H.   ? ?Reviewed their medical, surgical, family, social, medication, and allergy history and updated chart as appropriate. ? ?Past Medical History:  ?Diagnosis Date  ? Allergy   ? 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 Arthritis   ? Atopic dermatitis   ? Diverticulosis   ? 2013 colonoscopy, without diverticulitis  ? Gout   ? Hyperlipidemia   ? Hypertension   ? 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 ? ?Past Surgical History:  ?Procedure Laterality Date  ? COLONOSCOPY  01/31/2012  ? Sigmoid Diverticulosis, otherwise normal. Repeat 2023, Dr. Silvano Rusk  ? FOREARM SURGERY    ? left trauma repair and skin graft s/p conveyer belt injury  ? KNEE SURGERY  2001  ? left, arthroscopic  ? MOUTH SURGERY    ? SKIN GRAFT    ? 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 TENDON EXPLORATION Right 01/16/2013  ? Procedure: TENDON EXPLORATION AND REPAIRS RIGHT RING AND SMALL FINGERS ;  Surgeon: Wynonia Sours, MD;  Location: [facility];  Service: Orthopedics;  Laterality: Right;  ? THUMB AMPUTATION    ? ? ?Family History  ?Problem Relation Age of Onset  ? Other Mother   ?     died  in [place]  ? Other Father   ?     died in [place]  ? Arthritis Brother   ? Hypertension Brother   ? Hypertension Sister   ? Stroke Paternal Grandfather   ? Diabetes Maternal Grandfather   ? Heart disease Sister   ? Hyperlipidemia Paternal Aunt   ? Cancer Sister   ?     cancer  ? Gout Brother   ? Hypertension Brother   ? Heart disease Brother   ? Kidney disease Brother   ? Colon cancer Neg Hx   ? Stomach cancer Neg Hx   ? ? ? ?Current Outpatient Medications:  ?  allopurinol (ZYLOPRIM) 300 MG tablet, TAKE 1 TABLET(300 MG) BY MOUTH DAILY, Disp: 90 tablet, Rfl: 3 ?  indomethacin (INDOCIN) 50 MG capsule, TAKE ONE CAPSULE BY MOUTH THREE TIMES DAILY WITH  MEALS for gout flare, Disp: 60 capsule, Rfl: 3 ?  lisinopril (ZESTRIL) 20 MG tablet, Take 1 tablet (20 mg total) by mouth daily., Disp: 90 tablet, Rfl: 3 ?  rosuvastatin (CRESTOR) 20 MG tablet, TAKE 1 TABLET(20 MG) BY MOUTH DAILY, Disp: 30 tablet, Rfl: 0 ? ?Allergies  ?Allergen Reactions  ? Amlodipine   ?  Ankle edema  ? ? ?Review of Systems  ?Constitutional:  Negative for chills, fever, malaise/fatigue and weight loss.  ?HENT:  Negative for congestion, ear  pain, hearing loss, sore throat and tinnitus.   ?Eyes:  Negative for blurred vision, pain and redness.  ?Respiratory:  Negative for cough, hemoptysis and shortness of breath.   ?Cardiovascular:  Negative for chest pain, palpitations, orthopnea, claudication and leg swelling.  ?Gastrointestinal:  Negative for abdominal pain, blood in stool, constipation, diarrhea, nausea and vomiting.  ?     Hemorrhoids  ?Genitourinary:  Negative for dysuria, flank pain, frequency, hematuria and urgency.  ?Musculoskeletal:  Positive for joint pain. Negative for falls and myalgias.  ?     Right shoulder pain ?  ?Skin:  Negative for itching and rash.  ?Neurological:  Negative for dizziness, tingling, speech change, weakness and headaches.  ?Endo/Heme/Allergies:  Negative for polydipsia. Does not bruise/bleed easily.  ?Psychiatric/Behavioral:   Negative for depression and memory loss. The patient is not nervous/anxious and does not have insomnia.   ? ? ?   ?Objective:  ?BP 138/80   Pulse 68   Ht 5' 8.5" (1.74 m)   Wt 242 lb 9.6 oz (110 kg)   BMI 36.35 kg/m?  ? ?Wt Readings from Last 3 Encounters:  ?10/22/21 242 lb 9.6 oz (110 kg)  ?11/23/20 244 lb 3.2 oz (110.8 kg)  ?07/20/20 241 lb (109.3 kg)  ? ?BP Readings from Last 3 Encounters:  ?10/22/21 138/80  ?11/23/20 120/80  ?07/20/20 129/76  ? ? ? ?Physical Exam ?Vitals and nursing note reviewed.  ?Constitutional:   ?   General: He is not in acute distress. ?   Appearance: Normal appearance. He is not ill-appearing.  ?HENT:  ?   Head: Normocephalic and atraumatic.  ?   Right Ear: External ear normal.  ?   Left Ear: External ear normal.  ?   Nose: Nose normal.  ?   Mouth/Throat:  ?   Mouth: Mucous membranes are moist.  ?   Pharynx: Oropharynx is clear.  ?Eyes:  ?   Extraocular Movements: Extraocular movements intact.  ?   Conjunctiva/sclera: Conjunctivae normal.  ?   Pupils: Pupils are equal, round, and reactive to light.  ?Neck:  ?   Vascular: No carotid bruit.  ?Cardiovascular:  ?   Rate and Rhythm: Normal rate and regular rhythm.  ?   Pulses: Normal pulses.  ?   Heart sounds: Normal heart sounds.  ?Pulmonary:  ?   Effort: Pulmonary effort is normal.  ?   Breath sounds: Normal breath sounds.  ?Abdominal:  ?   General: Bowel sounds are normal. There is no distension.  ?   Palpations: Abdomen is soft. There is no mass.  ?   Tenderness: There is no abdominal tenderness.  ?   Hernia: No hernia is present.  ?Genitourinary: ?   Penis: Normal.   ?   Testes: Normal.  ?   Comments: Prostate with moderate enlargement, no nodules, small inflammed ext hemorrhoids ?Musculoskeletal:     ?   General: Deformity present. No swelling or tenderness.  ?   Cervical back: Normal range of motion and neck supple. No tenderness.  ?   Right lower leg: No edema.  ?   Left lower leg: No edema.  ?   Comments: Status post left thumb  amputation,tophi of bilat elbows and knees,scarring up hand to forearm from prior injury, bilateral shoulder external rotation decreased, mild pain on the right shoulder with range of motion but otherwise no laxity no swelling no other deformity  ?Lymphadenopathy:  ?   Cervical: No cervical adenopathy.  ?Skin: ?  General: Skin is warm and dry.  ?   Capillary Refill: Capillary refill takes less than 2 seconds.  ?Neurological:  ?   General: No focal deficit present.  ?   Mental Status: He is alert and oriented to person, place, and time. Mental status is at baseline.  ?   Cranial Nerves: No cranial nerve deficit.  ?   Sensory: No sensory deficit.  ?   Motor: No weakness.  ?   Gait: Gait normal.  ?   Deep Tendon Reflexes: Reflexes normal.  ?   Comments: Normal strength and sensation of arms  ?Psychiatric:     ?   Mood and Affect: Mood normal.     ?   Behavior: Behavior normal.     ?   Judgment: Judgment normal.  ? ? ? ?Assessment and Plan :  ? ?Encounter Diagnoses  ?Name Primary?  ? Encounter for health maintenance examination in adult Yes  ? Chronic tophaceous gout   ? Current occasional smoker   ? Impaired fasting blood sugar   ? Hyperlipidemia, unspecified hyperlipidemia type   ? Family history of premature CAD   ? Essential hypertension   ? Xanthelasma   ? Vitamin D deficiency   ? Vaccine counseling   ? Screening for prostate cancer   ? Screen for colon cancer   ? Prostate enlargement   ? Polyarthralgia   ? Peripheral vascular disease ([facility])   ? Paresthesia of both lower extremities   ? Need for pneumococcal vaccination   ? Need for Td vaccine   ? Decreased range of motion of shoulder, unspecified laterality   ? Chronic right shoulder pain   ? Hemorrhoids, unspecified hemorrhoid type   ? Arcus senilis of both eyes   ? Chronic pain of right ankle   ? DDD (degenerative disc disease), lumbar   ? Other male erectile dysfunction   ? Status post amputation of left thumb   ? BMI 36.0-36.9,adult   ? ? ?Today you had a  preventative care visit or wellness visit.    Topics today may have included healthy lifestyle, diet, exercise, preventative care, vaccinations, sick and well care, proper use of emergency dept and after hours c

## 2021-10-23 LAB — CBC
Hematocrit: 44.7 % (ref 37.5–51.0)
Hemoglobin: 15.1 g/dL (ref 13.0–17.7)
MCH: 31.5 pg (ref 26.6–33.0)
MCHC: 33.8 g/dL (ref 31.5–35.7)
MCV: 93 fL (ref 79–97)
Platelets: 256 x10E3/uL (ref 150–450)
RBC: 4.8 x10E6/uL (ref 4.14–5.80)
RDW: 13.2 % (ref 11.6–15.4)
WBC: 7.4 x10E3/uL (ref 3.4–10.8)

## 2021-10-23 LAB — COMPREHENSIVE METABOLIC PANEL WITH GFR
ALT: 27 IU/L (ref 0–44)
AST: 22 IU/L (ref 0–40)
Albumin/Globulin Ratio: 2.2 (ref 1.2–2.2)
Albumin: 4.7 g/dL (ref 3.8–4.8)
Alkaline Phosphatase: 60 IU/L (ref 44–121)
BUN/Creatinine Ratio: 21 (ref 10–24)
BUN: 26 mg/dL (ref 8–27)
Bilirubin Total: 0.3 mg/dL (ref 0.0–1.2)
CO2: 22 mmol/L (ref 20–29)
Calcium: 9.5 mg/dL (ref 8.6–10.2)
Chloride: 105 mmol/L (ref 96–106)
Creatinine, Ser: 1.26 mg/dL (ref 0.76–1.27)
Globulin, Total: 2.1 g/dL (ref 1.5–4.5)
Glucose: 96 mg/dL (ref 70–99)
Potassium: 4.7 mmol/L (ref 3.5–5.2)
Sodium: 140 mmol/L (ref 134–144)
Total Protein: 6.8 g/dL (ref 6.0–8.5)
eGFR: 65 mL/min/1.73

## 2021-10-23 LAB — URINALYSIS
Bilirubin, UA: NEGATIVE
Glucose, UA: NEGATIVE
Ketones, UA: NEGATIVE
Leukocytes,UA: NEGATIVE
Nitrite, UA: NEGATIVE
Protein,UA: NEGATIVE
RBC, UA: NEGATIVE
Specific Gravity, UA: 1.021 (ref 1.005–1.030)
Urobilinogen, Ur: 1 mg/dL (ref 0.2–1.0)
pH, UA: 6 (ref 5.0–7.5)

## 2021-10-23 LAB — LIPID PANEL
Chol/HDL Ratio: 3.5 ratio (ref 0.0–5.0)
Cholesterol, Total: 147 mg/dL (ref 100–199)
HDL: 42 mg/dL
LDL Chol Calc (NIH): 85 mg/dL (ref 0–99)
Triglycerides: 112 mg/dL (ref 0–149)
VLDL Cholesterol Cal: 20 mg/dL (ref 5–40)

## 2021-10-23 LAB — PSA: Prostate Specific Ag, Serum: 0.9 ng/mL (ref 0.0–4.0)

## 2021-10-23 LAB — HEMOGLOBIN A1C
Est. average glucose Bld gHb Est-mCnc: 123 mg/dL
Hgb A1c MFr Bld: 5.9 % — ABNORMAL HIGH (ref 4.8–5.6)

## 2021-10-23 LAB — VITAMIN D 25 HYDROXY (VIT D DEFICIENCY, FRACTURES): Vit D, 25-Hydroxy: 19.9 ng/mL — ABNORMAL LOW (ref 30.0–100.0)

## 2021-10-25 ENCOUNTER — Other Ambulatory Visit: Payer: Self-pay | Admitting: Medical

## 2021-10-25 MED ORDER — ALLOPURINOL 300 MG PO TABS: ORAL_TABLET | ORAL | 3 refills | Status: DC

## 2021-10-25 MED ORDER — ROSUVASTATIN CALCIUM 20 MG PO TABS: ORAL_TABLET | ORAL | 3 refills | Status: DC

## 2021-10-25 MED ORDER — LISINOPRIL 20 MG PO TABS: 20.0000 mg | ORAL_TABLET | Freq: Every day | ORAL | 3 refills | Status: DC

## 2021-10-25 MED ORDER — VITAMIN D 25 MCG (1000 UNIT) PO TABS: 1000.0000 [IU] | ORAL_TABLET | Freq: Every day | ORAL | 3 refills | Status: DC

## 2021-10-25 MED ORDER — INDOMETHACIN 50 MG PO CAPS: ORAL_CAPSULE | ORAL | 3 refills | Status: DC

## 2021-11-29 ENCOUNTER — Other Ambulatory Visit: Payer: Self-pay | Admitting: Medical

## 2021-12-06 ENCOUNTER — Other Ambulatory Visit: Payer: Self-pay

## 2021-12-06 MED ORDER — ALLOPURINOL 300 MG PO TABS: ORAL_TABLET | ORAL | 3 refills | Status: DC

## 2022-02-16 ENCOUNTER — Encounter: Payer: Self-pay | Admitting: Internal Medicine

## 2022-03-21 ENCOUNTER — Ambulatory Visit (INDEPENDENT_AMBULATORY_CARE_PROVIDER_SITE_OTHER): Payer: 59 | Admitting: Medical

## 2022-03-21 VITALS — BP 122/80 | HR 65 | Temp 97.2°F | Wt 240.2 lb

## 2022-03-21 DIAGNOSIS — B351 Tinea unguium: Secondary | ICD-10-CM

## 2022-03-21 DIAGNOSIS — Z23 Encounter for immunization: Secondary | ICD-10-CM

## 2022-03-21 DIAGNOSIS — M25476 Effusion, unspecified foot: Secondary | ICD-10-CM

## 2022-03-21 DIAGNOSIS — M79672 Pain in left foot: Secondary | ICD-10-CM

## 2022-03-21 DIAGNOSIS — E559 Vitamin D deficiency, unspecified: Secondary | ICD-10-CM

## 2022-03-21 NOTE — Patient Instructions (Signed)
We are referring you to podiatry /foot doctor.    I suspect this is either morton's neuroma or possibly early plantar wart formation.  Both can cause pain  This is likely due to excess pressure on that part of the foot  Wears good supportive shoes.  Avoid flip flops, flats, barefooted.  Consider getting adhesive moleskin pads at the pharmacy to use to pad that area of the foot for now.     Expect a phone call about foot doctor for appt

## 2022-03-21 NOTE — Progress Notes (Signed)
Subjective:  Troy Stevens is a 61 y.o. male who presents for Chief Complaint  Patient presents with   Foot Swelling    Foot swelling inbetween 2 toes. Will get a burning stinging feeling. Would flu shot today     Here for foot swelling.  He notes he has been having some soft tissue swelling and pain in between toes on the left foot.  Gets some burning and stinging sensation.  No injury or trauma.  He would like a flu shot today  He has history of vitamin D deficiency, compliant with over-the-counter vitamin D supplement and sun exposure  No other aggravating or relieving factors.    No other c/o.  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 90 tablet 3   lisinopril (ZESTRIL) 20 MG tablet Take 1 tablet (20 mg total) by mouth daily. 90 tablet 3   rosuvastatin (CRESTOR) 20 MG tablet TAKE 1 TABLET(20 MG) BY MOUTH DAILY 90 tablet 3   gabapentin (NEURONTIN) 100 MG capsule 1 capsule Orally Once a day     No current facility-administered medications on file prior to visit.    The following portions of the patient's history were reviewed and updated as appropriate: allergies, current medications, past family history, past medical history, past social history, past surgical history and problem list.  ROS Otherwise as in subjective above   Objective: BP 122/80   Pulse 65   Temp (!) 97.2 F (36.2 C)   Wt 240 lb 3.2 oz (109 kg)   BMI 35.99 kg/m   General appearance:  alert, no distress, well developed, well nourished Soft tissue swelling and tenderness of the left volar foot ball of the foot around the second and third digit MTP, there is 2 areas that suggest even some early plantar wart formation over the MTP volar of the great toe and third today, otherwise foot nontender.  Several toenails are thickened with some discoloration on the distal part of the nail Feet neurovascularly intact   Assessment: Encounter Diagnoses  Name Primary?   Foot pain, left Yes   Soft tissue swelling of joint of foot    Onychomycosis    Needs flu shot      Plan: Patient Instructions  We are referring you to podiatry /foot doctor.    I suspect this is either morton's neuroma or possibly early plantar wart formation.  Both can cause pain  This is likely due to excess pressure on that part of the foot  Wears good supportive shoes.  Avoid flip flops, flats, barefooted.  Consider getting adhesive moleskin pads at the pharmacy to use to pad that area of the foot for now.     Expect a phone call about foot doctor for appt   Counseled on the influenza virus vaccine.  Vaccine information sheet given.  Influenza vaccine given after consent obtained.  Vitamin D deficiency-continue supplement  Troy Stevens  was seen today for foot swelling.  Diagnoses and all orders for this visit:  Foot pain, left  Soft tissue swelling of joint of foot  Onychomycosis  Needs flu shot -     Flu Vaccine QUAD 39moIM (Fluarix, Fluzone & Alfiuria Quad PF)   Follow up: pending podiatry consult

## 2022-03-25 ENCOUNTER — Ambulatory Visit (INDEPENDENT_AMBULATORY_CARE_PROVIDER_SITE_OTHER): Payer: 59 | Admitting: Podiatry

## 2022-03-25 ENCOUNTER — Encounter: Payer: Self-pay | Admitting: Podiatry

## 2022-03-25 ENCOUNTER — Ambulatory Visit (INDEPENDENT_AMBULATORY_CARE_PROVIDER_SITE_OTHER): Payer: 59

## 2022-03-25 DIAGNOSIS — M7752 Other enthesopathy of left foot: Secondary | ICD-10-CM

## 2022-03-25 DIAGNOSIS — M79672 Pain in left foot: Secondary | ICD-10-CM

## 2022-03-25 MED ORDER — TRIAMCINOLONE ACETONIDE 10 MG/ML IJ SUSP
10.0000 mg | Freq: Once | INTRAMUSCULAR | Status: AC
  Administered 2022-03-25: 10 mg

## 2022-03-27 NOTE — Progress Notes (Signed)
Subjective:   Patient ID: Troy Stevens, male   DOB: 61 y.o.   MRN: [id number]   HPI Patient has presented with a lot of pain in the left second and third toes of several months duration.  It is triggered when walking or standing too long and he is on his feet at work most of the day.  Patient does not smoke likes to be active   Review of Systems  All other systems reviewed and are negative.       Objective:  Physical Exam Vitals and nursing note reviewed.  Constitutional:      Appearance: He is well-developed.  Pulmonary:     Effort: Pulmonary effort is normal.  Musculoskeletal:        General: Normal range of motion.  Skin:    General: Skin is warm.  Neurological:     Mental Status: He is alert.     Neurovascular status intact muscle strength adequate range of motion within normal limits with patient found to have exquisite pain moderate swelling around the second MPJ left with slight pain into the third MPJ.  There is good digital perfusion well-oriented x3 with moderate depression of the arch noted     Assessment:  Acute capsulitis of the second MPJ mildly in the third MPJ left with structural deformity mild in nature     Plan:  H&P spent a great deal time educating him on deformity treatment options and working to take an aggressive conservative option.  I did do an anesthesia block of the forefoot left I then aspirated the second MPJ getting out a small amount of clear fluid injected with quarter cc of dexamethasone Kenalog and applied thick padding to reduce pressure on the joint along with rigid bottom shoes.  Patient will be seen back again and is encouraged to be active but try to keep pressure off the foot  X-rays indicate no signs of arthritis or stress fracture associated with the condition

## 2022-03-28 ENCOUNTER — Other Ambulatory Visit: Payer: Self-pay | Admitting: Podiatry

## 2022-03-28 ENCOUNTER — Telehealth: Payer: Self-pay | Admitting: Podiatry

## 2022-03-28 MED ORDER — DICLOFENAC SODIUM 75 MG PO TBEC: 75.0000 mg | DELAYED_RELEASE_TABLET | Freq: Two times a day (BID) | ORAL | 2 refills | Status: DC

## 2022-03-28 NOTE — Telephone Encounter (Signed)
Patient was seen the past Friday.Troy Stevens He was suppose to be prescribed a medication for anti-inflammatory.  Please advise

## 2022-03-28 NOTE — Telephone Encounter (Signed)
Sent to pharmacy 

## 2022-04-08 ENCOUNTER — Ambulatory Visit (INDEPENDENT_AMBULATORY_CARE_PROVIDER_SITE_OTHER): Payer: 59 | Admitting: Podiatry

## 2022-04-08 DIAGNOSIS — M7752 Other enthesopathy of left foot: Secondary | ICD-10-CM | POA: Diagnosis not present

## 2022-04-09 NOTE — Progress Notes (Signed)
Subjective:   Patient ID: Troy Stevens, male   DOB: 61 y.o.   MRN: [id number]   HPI Patient presents stating that his pain has reduced significantly and he is only having mild pain upon excessive activity   ROS      Objective:  Physical Exam  Neurovascular status found to be intact with patient found to have significant diminishment of discomfort forefoot left with fluid in the joint that has reduced with previous treatment     Assessment:  Improvement of capsulitis second MPJ left with only mild discomfort upon excessive activity     Plan:  Reviewed shoe gear modifications and padding and discussed future treatments if symptoms were to recur or persist.  Patient to be seen back as needed

## 2022-04-11 ENCOUNTER — Other Ambulatory Visit: Payer: Self-pay | Admitting: Podiatry

## 2022-04-11 DIAGNOSIS — M7752 Other enthesopathy of left foot: Secondary | ICD-10-CM

## 2022-10-06 ENCOUNTER — Encounter: Payer: Self-pay | Admitting: Podiatry

## 2022-10-06 ENCOUNTER — Ambulatory Visit (INDEPENDENT_AMBULATORY_CARE_PROVIDER_SITE_OTHER): Payer: 59 | Admitting: Podiatry

## 2022-10-06 ENCOUNTER — Ambulatory Visit (INDEPENDENT_AMBULATORY_CARE_PROVIDER_SITE_OTHER): Payer: 59

## 2022-10-06 DIAGNOSIS — M2042 Other hammer toe(s) (acquired), left foot: Secondary | ICD-10-CM

## 2022-10-06 DIAGNOSIS — M7752 Other enthesopathy of left foot: Secondary | ICD-10-CM

## 2022-10-06 MED ORDER — TRIAMCINOLONE ACETONIDE 10 MG/ML IJ SUSP
10.0000 mg | Freq: Once | INTRAMUSCULAR | Status: AC
Start: 2022-10-06 — End: 2022-10-06
  Administered 2022-10-06: 10 mg

## 2022-10-06 NOTE — Progress Notes (Signed)
Subjective:   Patient ID: Troy Stevens, male   DOB: 62 y.o.   MRN: [id number]   HPI Patient states he did well for several months but the pain has started back over the last couple months and it feels like he is walking abnormally and that the toe has lifted more from previous treatment neurovascular status   ROS      Objective:  Physical Exam  Intact with moderate elevation second digit left with significant inflammation of the second MPJ     Assessment:  Inflammatory capsulitis of the second MPJ left with strong possibility for flexor plate stretch or possible tear     Plan:  H&P reviewed and I am going to do 1 more injection today because we did get 6 7 months of relief but if it shortens up it is can require digital fusion with shortening osteotomy depending on response.  At this point I did did a forefoot block I aspirated the joint is able to get out a small amount of clear fluid I injected quarter cc dexamethasone Kenalog applied thick plantar padding instructed on rigid shoes reappoint 6 months to reevaluate earlier if needed  X-rays indicate the second toe has lifted up some from previous visit and putting more stress against the joint surface

## 2022-10-28 ENCOUNTER — Ambulatory Visit (INDEPENDENT_AMBULATORY_CARE_PROVIDER_SITE_OTHER): Payer: 59 | Admitting: Medical

## 2022-10-28 VITALS — BP 122/70 | HR 64 | Ht 67.5 in | Wt 228.6 lb

## 2022-10-28 DIAGNOSIS — Z720 Tobacco use: Secondary | ICD-10-CM

## 2022-10-28 DIAGNOSIS — Z1211 Encounter for screening for malignant neoplasm of colon: Secondary | ICD-10-CM | POA: Diagnosis not present

## 2022-10-28 DIAGNOSIS — Z0000 Encounter for general adult medical examination without abnormal findings: Secondary | ICD-10-CM | POA: Diagnosis not present

## 2022-10-28 DIAGNOSIS — E559 Vitamin D deficiency, unspecified: Secondary | ICD-10-CM | POA: Diagnosis not present

## 2022-10-28 DIAGNOSIS — H18413 Arcus senilis, bilateral: Secondary | ICD-10-CM

## 2022-10-28 DIAGNOSIS — R0689 Other abnormalities of breathing: Secondary | ICD-10-CM | POA: Insufficient documentation

## 2022-10-28 DIAGNOSIS — M2550 Pain in unspecified joint: Secondary | ICD-10-CM | POA: Diagnosis not present

## 2022-10-28 DIAGNOSIS — Z6835 Body mass index (BMI) 35.0-35.9, adult: Secondary | ICD-10-CM | POA: Insufficient documentation

## 2022-10-28 DIAGNOSIS — N400 Enlarged prostate without lower urinary tract symptoms: Secondary | ICD-10-CM

## 2022-10-28 DIAGNOSIS — Z125 Encounter for screening for malignant neoplasm of prostate: Secondary | ICD-10-CM | POA: Diagnosis not present

## 2022-10-28 DIAGNOSIS — H0260 Xanthelasma of unspecified eye, unspecified eyelid: Secondary | ICD-10-CM

## 2022-10-28 DIAGNOSIS — M5136 Other intervertebral disc degeneration, lumbar region: Secondary | ICD-10-CM

## 2022-10-28 DIAGNOSIS — E785 Hyperlipidemia, unspecified: Secondary | ICD-10-CM | POA: Diagnosis not present

## 2022-10-28 DIAGNOSIS — M1A09X0 Idiopathic chronic gout, multiple sites, without tophus (tophi): Secondary | ICD-10-CM | POA: Insufficient documentation

## 2022-10-28 DIAGNOSIS — I10 Essential (primary) hypertension: Secondary | ICD-10-CM | POA: Diagnosis not present

## 2022-10-28 DIAGNOSIS — Z89012 Acquired absence of left thumb: Secondary | ICD-10-CM

## 2022-10-28 DIAGNOSIS — Z136 Encounter for screening for cardiovascular disorders: Secondary | ICD-10-CM

## 2022-10-28 DIAGNOSIS — R252 Cramp and spasm: Secondary | ICD-10-CM

## 2022-10-28 DIAGNOSIS — R7301 Impaired fasting glucose: Secondary | ICD-10-CM

## 2022-10-28 DIAGNOSIS — I739 Peripheral vascular disease, unspecified: Secondary | ICD-10-CM

## 2022-10-28 LAB — COMPREHENSIVE METABOLIC PANEL

## 2022-10-28 LAB — POCT URINALYSIS DIP (PROADVANTAGE DEVICE)
Bilirubin, UA: NEGATIVE
Blood, UA: NEGATIVE
Glucose, UA: NEGATIVE mg/dL
Ketones, POC UA: NEGATIVE mg/dL
Leukocytes, UA: NEGATIVE
Nitrite, UA: NEGATIVE
Protein Ur, POC: NEGATIVE mg/dL
Specific Gravity, Urine: 1.005
Urobilinogen, Ur: NEGATIVE
pH, UA: 7.5 (ref 5.0–8.0)

## 2022-10-28 LAB — CBC WITH DIFFERENTIAL/PLATELET
Basos: 1 %
Immature Grans (Abs): 0 10*3/uL (ref 0.0–0.1)
Lymphocytes Absolute: 1.6 10*3/uL (ref 0.7–3.1)
MCH: 32.4 pg (ref 26.6–33.0)
MCV: 93 fL (ref 79–97)

## 2022-10-28 LAB — URIC ACID

## 2022-10-28 LAB — VITAMIN D 25 HYDROXY (VIT D DEFICIENCY, FRACTURES)

## 2022-10-28 MED ORDER — ALLOPURINOL 300 MG PO TABS: ORAL_TABLET | ORAL | 3 refills | Status: DC

## 2022-10-28 MED ORDER — ROSUVASTATIN CALCIUM 20 MG PO TABS: ORAL_TABLET | ORAL | 3 refills | Status: DC

## 2022-10-28 MED ORDER — LISINOPRIL 20 MG PO TABS: 20.0000 mg | ORAL_TABLET | Freq: Every day | ORAL | 3 refills | Status: DC

## 2022-10-28 NOTE — Progress Notes (Addendum)
Subjective:   HPI  Troy Stevens is a 62 y.o. male who presents for Chief Complaint  Patient presents with   Annual Exam    Fasting cpe, cramping in hands and legs    Patient Care Team: Ramie Erman, Kermit Balo, PA-C as PCP - General (Family Medicine) Chilton Si, MD as Attending Physician (Cardiology) Cindee Salt, MD as Consulting Physician (Orthopedic Surgery) Fuller Plan, MD as Consulting Physician (Rheumatology) Larey Dresser, DPM as Consulting Physician (Podiatry) Iva Boop, MD as Consulting Physician (Gastroenterology)  Concerns: Lately getting some cramps in hands, knees bilat been aching.  Right index finger sometimes getting stuck in flexion.  Sometimes getting some left shoulder aches.    Saw podiatry recently, had some treatment, and having trouble with walking.    Hasn't seen rheumatology lately.  HTN - compliant with medication.   No cp, no DOE.    Hyperlipemia - compliant with medicaiton without side effects   Reviewed their medical, surgical, family, social, medication, and allergy history and updated chart as appropriate.  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 Repeat 2023, Dr. 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   TENDON EXPLORATION Right 01/16/2013   Procedure: TENDON EXPLORATION AND REPAIRS RIGHT RING AND SMALL FINGERS ;  Surgeon: Nicki Reaper, MD;  Location: [facility];   Service: Orthopedics;  Laterality: Right;   THUMB AMPUTATION      Family History  Problem Relation Age of Onset   Other Mother        died in MVA   Other Father        died in MVA   Arthritis Brother    Hypertension Brother    Hypertension Sister    Stroke Paternal Grandfather    Diabetes Maternal Grandfather    Heart disease Sister    Hyperlipidemia Paternal Aunt    Cancer Sister        cancer   Gout Brother    Hypertension Brother    Heart disease Brother    Kidney disease Brother    Colon cancer Neg Hx    Stomach cancer Neg Hx      Current Outpatient Medications:    allopurinol (ZYLOPRIM) 300 MG tablet, TAKE 1 TABLET(300 MG) BY MOUTH DAILY, Disp: 90 tablet, Rfl: 3   diclofenac (VOLTAREN) 75 MG EC tablet, Take 1 tablet (75 mg total) by mouth 2 (two) times daily., Disp: 50 tablet, Rfl: 2   gabapentin (NEURONTIN) 100 MG capsule, 1 capsule Orally Once a day, Disp: , Rfl:    lisinopril (ZESTRIL) 20 MG tablet, Take 1 tablet (20 mg total) by mouth daily., Disp: 90 tablet, Rfl: 3   rosuvastatin (CRESTOR) 20 MG tablet, TAKE 1 TABLET(20 MG) BY MOUTH DAILY, Disp: 90 tablet, Rfl: 3   cholecalciferol (VITAMIN D3) 25  MCG (1000 UNIT) tablet, Take 1 tablet (1,000 Units total) by mouth daily., Disp: 90 tablet, Rfl: 3  Allergies  Allergen Reactions   Amlodipine     Ankle edema    Review of Systems  Constitutional:  Negative for chills, fever, malaise/fatigue and weight loss.  HENT:  Negative for congestion, ear pain, hearing loss, sore throat and tinnitus.   Eyes:  Negative for blurred vision, pain and redness.  Respiratory:  Negative for cough, hemoptysis and shortness of breath.   Cardiovascular:  Negative for chest pain, palpitations, orthopnea, claudication and leg swelling.  Gastrointestinal:  Negative for abdominal pain, blood in stool, constipation, diarrhea, nausea and vomiting.  Genitourinary:  Negative for dysuria, flank pain, frequency, hematuria and urgency.   Musculoskeletal:  Positive for back pain and joint pain. Negative for falls and myalgias.       Knee pains, back pain, left shoulder pain, hand pain  Skin:  Negative for itching and rash.  Neurological:  Negative for dizziness, tingling, speech change, weakness and headaches.  Endo/Heme/Allergies:  Negative for polydipsia. Does not bruise/bleed easily.  Psychiatric/Behavioral:  Negative for depression and memory loss. The patient is not nervous/anxious and does not have insomnia.         Objective:  BP 122/70   Pulse 64   Ht 5' 7.5" (1.715 m)   Wt 228 lb 9.6 oz (103.7 kg)   BMI 35.28 kg/m   Wt Readings from Last 3 Encounters:  10/28/22 228 lb 9.6 oz (103.7 kg)  03/21/22 240 lb 3.2 oz (109 kg)  10/22/21 242 lb 9.6 oz (110 kg)   BP Readings from Last 3 Encounters:  10/28/22 122/70  03/21/22 122/80  10/22/21 138/80     Physical Exam Vitals and nursing note reviewed.  Constitutional:      General: He is not in acute distress.    Appearance: Normal appearance. He is not ill-appearing.  HENT:     Head: Normocephalic and atraumatic.     Right Ear: External ear normal.     Left Ear: External ear normal.     Nose: Nose normal.     Mouth/Throat:     Mouth: Mucous membranes are moist.     Pharynx: Oropharynx is clear.  Eyes:     Extraocular Movements: Extraocular movements intact.     Conjunctiva/sclera: Conjunctivae normal.     Pupils: Pupils are equal, round, and reactive to light.     Comments: Arcus senilius bilat   Neck:     Vascular: No carotid bruit.  Cardiovascular:     Rate and Rhythm: Normal rate and regular rhythm.     Pulses: Normal pulses.     Heart sounds: Normal heart sounds.  Pulmonary:     Effort: Pulmonary effort is normal.     Breath sounds: No wheezing, rhonchi or rales.     Comments: Decreased breath sounds in general Abdominal:     General: Bowel sounds are normal. There is no distension.     Palpations: Abdomen is soft. There is no mass.      Tenderness: There is no abdominal tenderness.     Hernia: No hernia is present.  Genitourinary:    Comments: Declines GU and rectal exam today Musculoskeletal:        General: Deformity present. No swelling or tenderness.     Cervical back: Normal range of motion and neck supple. No tenderness.     Right lower leg: No edema.     Left lower leg:  No edema.     Comments: Status post left thumb amputation,tophi of bilat elbows and knees,scarring up hand to forearm from prior injury, bilateral shoulder external rotation decreased, no specfiic trigger finger or nodule of either index finger, mild tendnerss over bilat patellar tendiniits,  but otherwise no laxity no swelling no other deformity  Lymphadenopathy:     Cervical: No cervical adenopathy.  Skin:    General: Skin is warm and dry.     Capillary Refill: Capillary refill takes less than 2 seconds.  Neurological:     General: No focal deficit present.     Mental Status: He is alert and oriented to person, place, and time. Mental status is at baseline.     Cranial Nerves: No cranial nerve deficit.     Sensory: No sensory deficit.     Motor: No weakness.     Gait: Gait normal.     Deep Tendon Reflexes: Reflexes normal.     Comments: Normal strength and sensation of arms  Psychiatric:        Mood and Affect: Mood normal.        Behavior: Behavior normal.        Judgment: Judgment normal.      Assessment and Plan :   Encounter Diagnoses  Name Primary?   Routine general medical examination at a health care facility Yes   Hyperlipidemia, unspecified hyperlipidemia type    Essential hypertension    Vitamin D deficiency    Screening for prostate cancer    Screen for colon cancer    Polyarthralgia    Peripheral vascular disease (HCC)    Impaired fasting blood sugar    Current occasional smoker    Chronic gout of multiple sites, unspecified cause    Decreased breath sounds     Today you had a preventative care visit or wellness  visit.    Topics today may have included healthy lifestyle, diet, exercise, preventative care, vaccinations, sick and well care, proper use of emergency dept and after hours care, as well as other concerns.     Recommendations: Continue to return yearly for your annual wellness and preventative care visits.  This gives [place] a chance to discuss healthy lifestyle, exercise, vaccinations, review your chart record, and perform screenings where appropriate.  I recommend you see your eye doctor yearly for routine vision care.  I recommend you see your dentist yearly for routine dental care including hygiene visits twice yearly.   Vaccination recommendations were reviewed Immunization History  Administered Date(s) Administered   Influenza,inj,Quad PF,6+ Mos 03/02/2015, 05/12/2016, 03/21/2017, 05/07/2018, 04/08/2019, 03/19/2020, 03/21/2022   Moderna Covid-19 Vaccine Bivalent Booster 8yrs & up 08/16/2021   Moderna Sars-Covid-2 Vaccination 09/19/2019, 10/17/2019, 06/09/2020   Pneumococcal Polysaccharide-23 03/02/2015, 07/16/2020   Td 10/22/2021   Tdap 12/13/2011   Zoster Recombinat (Shingrix) 11/23/2020, 03/05/2021     Screening for cancer: Colon cancer screening:  Due for repeat colonoscopy 2023.  He didn't go last year as recommended. Updated referral today  We discussed PSA, prostate exam, and prostate cancer screening risks/benefits.     Skin cancer screening: Check your skin regularly for new changes, growing lesions, or other lesions of concern Come in for evaluation if you have skin lesions of concern.  Lung cancer screening: If you have a greater than 30 pack year history of tobacco use, then you qualify for lung cancer screening with a chest CT scan  We currently don't have screenings for other cancers besides breast, cervical, colon, and lung  cancers.  If you have a strong family history of cancer or have other cancer screening concerns, please let me know.    Bone health: Get  at least 150 minutes of aerobic exercise weekly Get weight bearing exercise at least once weekly   Heart health: Get at least 150 minutes of aerobic exercise weekly Limit alcohol It is important to maintain a healthy blood pressure and healthy diet numbers  C/t follow up with cardiology  We discussed pursuing a CT coronary score.  Order placed   Separate significant issues discussed: Hypertension-continue lisinopril 20 mg daily  Hyperlipidemia-routine labs today, continue Crestor 20 mg daily  History of gout and tophi-continue allopurinol for prevention  Vitamin D deficiency-updated labs today.  He is currently taking 1000 units daily vitamin D supplement  Cramping particularly in the hands and knees and legs-updated labs today.  He is drinking plenty of water throughout the day  Foot pain, recent visit with podiatry with some intervention, continue follow-up with podiatry  Decreased lung sounds on exam-  Continues to smoke marijuana and cigars.  We discussed the need to quit smoking  Screen for heart disease-we will go ahead and order CT coronary calcium score test    Saman was seen today for annual exam.  Diagnoses and all orders for this visit:  Routine general medical examination at a health care facility -     CBC with Differential -     Comprehensive metabolic panel -     Lipid Panel -     TSH -     PSA, total and free -     Ambulatory referral to Gastroenterology -     Uric Acid -     CT CARDIAC SCORING (SELF PAY ONLY); Future -     Magnesium -     POCT Urinalysis DIP (Proadvantage Device)  Hyperlipidemia, unspecified hyperlipidemia type -     Lipid Panel  Essential hypertension -     CBC with Differential -     Comprehensive metabolic panel  Vitamin D deficiency -     Vitamin D, 25-hydroxy  Screening for prostate cancer  Screen for colon cancer -     Ambulatory referral to Gastroenterology  Polyarthralgia  Peripheral vascular disease  (HCC)  Impaired fasting blood sugar  Current occasional smoker  Chronic gout of multiple sites, unspecified cause -     Uric Acid  Decreased breath sounds -     Spirometry with Graph  Other orders -     allopurinol (ZYLOPRIM) 300 MG tablet; TAKE 1 TABLET(300 MG) BY MOUTH DAILY -     lisinopril (ZESTRIL) 20 MG tablet; Take 1 tablet (20 mg total) by mouth daily. -     rosuvastatin (CRESTOR) 20 MG tablet; TAKE 1 TABLET(20 MG) BY MOUTH DAILY    Follow-up pending labs, yearly for physical

## 2022-10-29 LAB — LIPID PANEL
Chol/HDL Ratio: 3.1 ratio (ref 0.0–5.0)
Cholesterol, Total: 141 mg/dL (ref 100–199)
HDL: 46 mg/dL (ref >39)
LDL Chol Calc (NIH): 77 mg/dL (ref 0–99)
Triglycerides: 94 mg/dL (ref 0–149)
VLDL Cholesterol Cal: 18 mg/dL (ref 5–40)

## 2022-10-29 LAB — PSA, TOTAL AND FREE
PSA, Free Pct: 41.3 %
PSA, Free: 0.33 ng/mL
Prostate Specific Ag, Serum: 0.8 ng/mL (ref 0.0–4.0)

## 2022-10-29 LAB — COMPREHENSIVE METABOLIC PANEL
ALT: 32 IU/L (ref 0–44)
AST: 26 IU/L (ref 0–40)
Albumin/Globulin Ratio: 2 (ref 1.2–2.2)
Albumin: 4.8 g/dL (ref 3.9–4.9)
Alkaline Phosphatase: 52 IU/L (ref 44–121)
Bilirubin Total: 0.5 mg/dL (ref 0.0–1.2)
CO2: 20 mmol/L (ref 20–29)
Calcium: 9.8 mg/dL (ref 8.6–10.2)
Chloride: 102 mmol/L (ref 96–106)
Globulin, Total: 2.4 g/dL (ref 1.5–4.5)
Potassium: 4.4 mmol/L (ref 3.5–5.2)

## 2022-10-29 LAB — CBC WITH DIFFERENTIAL/PLATELET
Basophils Absolute: 0 10*3/uL (ref 0.0–0.2)
EOS (ABSOLUTE): 0.2 10*3/uL (ref 0.0–0.4)
Eos: 3 %
Hematocrit: 47.1 % (ref 37.5–51.0)
Hemoglobin: 16.4 g/dL (ref 13.0–17.7)
Immature Granulocytes: 0 %
Lymphs: 28 %
MCHC: 34.8 g/dL (ref 31.5–35.7)
Monocytes Absolute: 0.5 10*3/uL (ref 0.1–0.9)
Monocytes: 8 %
Neutrophils Absolute: 3.6 10*3/uL (ref 1.4–7.0)
Neutrophils: 60 %
Platelets: 274 10*3/uL (ref 150–450)
RBC: 5.06 x10E6/uL (ref 4.14–5.80)
RDW: 13.4 % (ref 11.6–15.4)
WBC: 5.9 10*3/uL (ref 3.4–10.8)

## 2022-10-29 LAB — MAGNESIUM: Magnesium: 2.1 mg/dL (ref 1.6–2.3)

## 2022-10-29 LAB — TSH: TSH: 0.904 u[IU]/mL (ref 0.450–4.500)

## 2022-10-30 ENCOUNTER — Other Ambulatory Visit: Payer: Self-pay | Admitting: Medical

## 2022-10-30 MED ORDER — VITAMIN D 25 MCG (1000 UNIT) PO TABS: 1000.0000 [IU] | ORAL_TABLET | Freq: Every day | ORAL | 3 refills | Status: DC

## 2022-10-30 NOTE — Progress Notes (Signed)
Results sent through MyChart

## 2022-10-31 ENCOUNTER — Other Ambulatory Visit: Payer: Self-pay | Admitting: Podiatry

## 2022-11-03 NOTE — Telephone Encounter (Signed)
Patient last seen in April 2024.  Requested refill of diclofenac 75mg .  See that it was prescribed in the past for this patient.  Approved refill request for this NSAID

## 2022-11-04 NOTE — Telephone Encounter (Signed)
thanks

## 2022-11-12 HISTORY — PX: MOUTH SURGERY: SHX715

## 2022-12-09 ENCOUNTER — Ambulatory Visit (AMBULATORY_SURGERY_CENTER): Payer: 59

## 2022-12-09 ENCOUNTER — Encounter: Payer: Self-pay | Admitting: Internal Medicine

## 2022-12-09 ENCOUNTER — Ambulatory Visit: Payer: 59 | Admitting: Podiatry

## 2022-12-09 VITALS — Ht 67.5 in | Wt 223.0 lb

## 2022-12-09 DIAGNOSIS — Z1211 Encounter for screening for malignant neoplasm of colon: Secondary | ICD-10-CM

## 2022-12-09 MED ORDER — NA SULFATE-K SULFATE-MG SULF 17.5-3.13-1.6 GM/177ML PO SOLN
1.0000 | Freq: Once | ORAL | 0 refills | Status: AC
Start: 2022-12-09 — End: 2022-12-09

## 2022-12-09 NOTE — Progress Notes (Signed)
No egg  allergy known to patient   No issues known to pt with past sedation  with any surgeries or procedures  Patient denies ever being told they had issues or difficulty with intubation   No FH of Malignant Hyperthermia  Pt is not on diet pills  Pt is not on  home 02   Pt is not on blood thinners   Pt denies issues with constipation   No A fib or A flutter  Have any cardiac testing pending--no  Pt instructed to use Singlecare.com or GoodRx for a price reduction on prep   Patient's chart reviewed by Cathlyn Parsons CRNA prior to previsit and patient appropriate for the [facility].  Previsit completed and red dot placed by patient's name on their procedure day (on provider's schedule).

## 2022-12-14 ENCOUNTER — Ambulatory Visit (INDEPENDENT_AMBULATORY_CARE_PROVIDER_SITE_OTHER): Payer: 59 | Admitting: Podiatry

## 2022-12-14 ENCOUNTER — Encounter: Payer: Self-pay | Admitting: Podiatry

## 2022-12-14 DIAGNOSIS — M7752 Other enthesopathy of left foot: Secondary | ICD-10-CM | POA: Diagnosis not present

## 2022-12-14 DIAGNOSIS — M2042 Other hammer toe(s) (acquired), left foot: Secondary | ICD-10-CM | POA: Diagnosis not present

## 2022-12-14 NOTE — Progress Notes (Signed)
Subjective:   Patient ID: Troy Stevens, male   DOB: 62 y.o.   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  Patient to be seen back as symptoms indicate

## 2022-12-19 ENCOUNTER — Ambulatory Visit (HOSPITAL_COMMUNITY): Payer: 59

## 2022-12-19 ENCOUNTER — Ambulatory Visit (AMBULATORY_SURGERY_CENTER): Payer: 59 | Admitting: Internal Medicine

## 2022-12-19 ENCOUNTER — Encounter: Payer: Self-pay | Admitting: Internal Medicine

## 2022-12-19 VITALS — BP 132/83 | HR 61 | Temp 98.3°F | Resp 14 | Ht 67.5 in | Wt 223.0 lb

## 2022-12-19 DIAGNOSIS — D124 Benign neoplasm of descending colon: Secondary | ICD-10-CM

## 2022-12-19 DIAGNOSIS — K635 Polyp of colon: Secondary | ICD-10-CM | POA: Diagnosis not present

## 2022-12-19 DIAGNOSIS — Z1211 Encounter for screening for malignant neoplasm of colon: Secondary | ICD-10-CM

## 2022-12-19 MED ORDER — SODIUM CHLORIDE 0.9 % IV SOLN
500.0000 mL | Freq: Once | INTRAVENOUS | Status: DC
Start: 2022-12-19 — End: 2022-12-19

## 2022-12-19 NOTE — Op Note (Signed)
[facility] Patient Name: Troy Stevens Procedure Date: 12/19/2022 7:09 AM MRN: [id number] Endoscopist: Iva Boop , MD, [id number] Age: 62 Referring MD:  Date of Birth: 1960/12/26 Gender: Male Account #: [id number] Procedure:                Colonoscopy Indications:              Screening for colorectal malignant neoplasm, Last                            colonoscopy: 2013 Medicines:                Monitored Anesthesia Care Procedure:                Pre-Anesthesia Assessment:                           - Prior to the procedure, a History and Physical                            was performed, and patient medications and                            allergies were reviewed. The patient's tolerance of                            previous anesthesia was also reviewed. The risks                            and benefits of the procedure and the sedation                            options and risks were discussed with the patient.                            All questions were answered, and informed consent                            was obtained. Prior Anticoagulants: The patient has                            taken no anticoagulant or antiplatelet agents. ASA                            Grade Assessment: II - A patient with mild systemic                            disease. After reviewing the risks and benefits,                            the patient was deemed in satisfactory condition to                            undergo the procedure.  After obtaining informed consent, the colonoscope                            was passed under direct vision. Throughout the                            procedure, the patient's blood pressure, pulse, and                            oxygen saturations were monitored continuously. The                            CF [id number] #[id number] was introduced through the anus                            and advanced to the the cecum, identified  by                            appendiceal orifice and ileocecal valve. The                            colonoscopy was performed without difficulty. The                            patient tolerated the procedure well. The quality                            of the bowel preparation was good. The ileocecal                            valve, appendiceal orifice, and rectum were                            photographed. The bowel preparation used was SUPREP                            via split dose instruction. Scope In: 8:09:40 AM Scope Out: 8:23:22 AM Scope Withdrawal Time: 0 hours 11 minutes 51 seconds  Total Procedure Duration: 0 hours 13 minutes 42 seconds  Findings:                 The digital rectal exam findings include enlarged                            prostate. Pertinent negatives include no palpable                            rectal lesions.                           Two sessile polyps were found in the distal sigmoid                            colon. The polyps were diminutive in size. These  polyps were removed with a cold snare. Resection                            and retrieval were complete. Verification of                            patient identification for the specimen was done.                            Estimated blood loss was minimal.                           Multiple diverticula were found in the sigmoid                            colon and descending colon.                           The exam was otherwise without abnormality on                            direct and retroflexion views. Complications:            No immediate complications. Estimated Blood Loss:     Estimated blood loss was minimal. Impression:               - Enlarged prostate found on digital rectal exam.                           - Two diminutive polyps in the distal sigmoid                            colon, removed with a cold snare. Resected and                             retrieved.                           - Severe diverticulosis in the sigmoid colon and in                            the descending colon.                           - The examination was otherwise normal on direct                            and retroflexion views. Recommendation:           - Patient has a contact number available for                            emergencies. The signs and symptoms of potential                            delayed complications were discussed with the  patient. Return to normal activities tomorrow.                            Written discharge instructions were provided to the                            patient.                           - Resume previous diet.                           - Continue present medications.                           - Repeat colonoscopy is recommended. The                            colonoscopy date will be determined after pathology                            results from today's exam become available for                            review. Iva Boop, MD 12/19/2022 8:32:29 AM This report has been signed electronically.

## 2022-12-19 NOTE — Progress Notes (Signed)
Called to room to assist during endoscopic procedure.  Patient ID and intended procedure confirmed with present staff. Received instructions for my participation in the procedure from the performing physician.  

## 2022-12-19 NOTE — Patient Instructions (Addendum)
Please read handouts provided. Continue present medications. Await pathology results.   YOU HAD AN ENDOSCOPIC PROCEDURE TODAY AT THE [facility]:   Refer to the procedure report that was given to you for any specific questions about what was found during the examination.  If the procedure report does not answer your questions, please call your gastroenterologist to clarify.  If you requested that your care partner not be given the details of your procedure findings, then the procedure report has been included in a sealed envelope for you to review at your convenience later.  YOU SHOULD EXPECT: Some feelings of bloating in the abdomen. Passage of more gas than usual.  Walking can help get rid of the air that was put into your GI tract during the procedure and reduce the bloating. If you had a lower endoscopy (such as a colonoscopy or flexible sigmoidoscopy) you may notice spotting of blood in your stool or on the toilet paper. If you underwent a bowel prep for your procedure, you may not have a normal bowel movement for a few days.  Please Note:  You might notice some irritation and congestion in your nose or some drainage.  This is from the oxygen used during your procedure.  There is no need for concern and it should clear up in a day or so.  SYMPTOMS TO REPORT IMMEDIATELY:  Following lower endoscopy (colonoscopy or flexible sigmoidoscopy):  Excessive amounts of blood in the stool  Significant tenderness or worsening of abdominal pains  Swelling of the abdomen that is new, acute  Fever of 100F or higher.  For urgent or emergent issues, a gastroenterologist can be reached at any hour by calling [phone]. Do not use MyChart messaging for urgent concerns.    DIET:  We do recommend a small meal at first, but then you may proceed to your regular diet.  Drink plenty of fluids but you should avoid alcoholic beverages for 24 hours.  ACTIVITY:  You should plan to take it easy for  the rest of today and you should NOT DRIVE or use heavy machinery until tomorrow (because of the sedation medicines used during the test).    FOLLOW UP: Our staff will call the number listed on your records the next business day following your procedure.  We will call around 7:15- 8:00 am to check on you and address any questions or concerns that you may have regarding the information given to you following your procedure. If we do not reach you, we will leave a message.     If any biopsies were taken you will be contacted by phone or by letter within the next 1-3 weeks.  Please call us at [phone] if you have not heard about the biopsies in 3 weeks.    SIGNATURES/CONFIDENTIALITY: You and/or your care partner have signed paperwork which will be entered into your electronic medical record.  These signatures attest to the fact that that the information above on your After Visit Summary has been reviewed and is understood.  Full responsibility of the confidentiality of this discharge information lies with you and/or your care-partner.I found and removed 2 tiny polyps today.  I will let you know pathology results and when to have another routine colonoscopy by mail and/or My Chart.  You still have diverticulosis - thickened muscle rings and pouches in the colon wall. Please read the handout about this condition.  I appreciate the opportunity to care for you. Iva Boop, MD, [facility]

## 2022-12-19 NOTE — Progress Notes (Signed)
[facility] History and Physical   Primary Care Physician:  Jac Canavan, PA-C   Reason for Procedure:   CRCA screening  Plan:    colonoscopy     HPI: Troy Stevens is a 62 y.o. male for screening exam  Neg colonoscopy 2013   Past Medical History:  Diagnosis Date   Amputation of thumb    left, s/p conveyer belt injury   Arthritis    gout   Atopic dermatitis    Diverticulosis    2013 colonoscopy, without diverticulitis   Gout    Hyperlipidemia    Hypertension    Xanthelasma of eyelid(374.51)     Past Surgical History:  Procedure Laterality Date   COLONOSCOPY  01/31/2012   Sigmoid Diverticulosis, otherwise normal. Repeat 2023, Dr. 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 Authorizing Provider  allopurinol (ZYLOPRIM) 300 MG tablet TAKE 1 TABLET(300 MG) BY MOUTH DAILY 10/28/22  Yes Tysinger, Kermit Balo, PA-C  cholecalciferol (VITAMIN D3) 25 MCG (1000 UNIT) tablet Take 1 tablet (1,000 Units total) by mouth daily. 10/30/22  Yes Tysinger, Kermit Balo, PA-C  lisinopril (ZESTRIL) 20 MG tablet Take 1 tablet (20 mg total) by mouth daily. 10/28/22  Yes Tysinger, Kermit Balo, PA-C  rosuvastatin (CRESTOR) 20 MG tablet TAKE 1 TABLET(20 MG) BY MOUTH DAILY 10/28/22  Yes Tysinger, Kermit Balo, PA-C  acetaminophen (TYLENOL) 325 MG tablet Take 650 mg by mouth every 6 (six) hours as needed.    [provider]  chlorhexidine (PERIDEX) 0.12 % solution 15 mLs 2 (two)  times daily. 11/23/22   [provider]  diclofenac (VOLTAREN) 75 MG EC tablet TAKE 1 TABLET(75 MG) BY MOUTH TWICE DAILY Patient taking differently: Pt takes as needed 11/03/22   McCaughan, Dia D, DPM  gabapentin (NEURONTIN) 100 MG capsule Takes as needed    [provider]    Current Outpatient Medications  Medication Sig Dispense Refill   allopurinol (ZYLOPRIM) 300 MG tablet TAKE 1 TABLET(300 MG) BY MOUTH DAILY 90 tablet 3   cholecalciferol (VITAMIN D3) 25 MCG (1000 UNIT) tablet Take 1 tablet (1,000 Units total) by mouth daily. 90 tablet 3   lisinopril (ZESTRIL) 20 MG tablet Take 1 tablet (20 mg total) by mouth daily. 90 tablet 3   rosuvastatin (CRESTOR) 20 MG tablet TAKE 1 TABLET(20 MG) BY MOUTH DAILY 90 tablet 3   acetaminophen (TYLENOL) 325 MG tablet Take 650 mg by mouth every 6 (six) hours as needed.     chlorhexidine (PERIDEX) 0.12 % solution 15 mLs 2 (two) times daily.     diclofenac (VOLTAREN) 75 MG EC tablet TAKE 1 TABLET(75 MG) BY MOUTH TWICE DAILY (Patient taking differently: Pt takes as needed) 50 tablet 2   gabapentin (NEURONTIN) 100 MG capsule Takes as needed     Current Facility-Administered Medications  Medication Dose Route Frequency Provider Last Rate Last Admin   0.9 %  sodium  chloride infusion  500 mL Intravenous Once Iva Boop, MD        Allergies as of 12/19/2022 - Review Complete 12/19/2022  Allergen Reaction Noted   Amlodipine  06/08/2016    Family History  Problem Relation Age of Onset   Other Mother        died in MVA   Other Father        died in MVA   Hypertension Sister    Heart disease Sister    Cancer Sister        cancer   Arthritis Brother    Hypertension Brother    Gout Brother    Hypertension Brother    Heart disease Brother    Kidney disease Brother    Hyperlipidemia Paternal Aunt    Diabetes Maternal Grandfather    Stroke Paternal Grandfather    Colon cancer Neg Hx    Stomach cancer Neg Hx    Colon polyps Neg  Hx    Esophageal cancer Neg Hx    Rectal cancer Neg Hx     Social History   Socioeconomic History   Marital status: Married    Spouse name: Not on file   Number of children: Not on file   Years of education: Not on file   Highest education level: Not on file  Occupational History   Occupation: laid off    Comment: use to work at [employer]  Tobacco Use   Smoking status: Former    Packs/day: 0.25    Years: 21.00    Additional pack years: 0.00    Total pack years: 5.25    Types: Cigarettes, Cigars    Start date: 05/12/2013    Quit date: 2017    Years since quitting: 7.5   Smokeless tobacco: Never   Tobacco comments:    3 years cigarettes when young  Vaping Use   Vaping Use: Never used  Substance and Sexual Activity   Alcohol use: Yes    Comment: Rarely, several months ago   Drug use: Yes    Frequency: 3.0 times per week    Types: Marijuana    Comment: other drugs in past, years ago   Sexual activity: Yes  Other Topics Concern   Not on file  Social History Narrative   Married, 2 children, exercise - 10,000 steps daily at work.   Working at [employer], pulls orders for paint, and does deliveries.  Walks on the job.  10/2021         Social Determinants of Health   Financial Resource Strain: Not on file  Food Insecurity: Not on file  Transportation Needs: Not on file  Physical Activity: Not on file  Stress: Not on file  Social Connections: Not on file  Intimate Partner Violence: Not on file    Review of Systems:  All other review of systems negative except as mentioned in the HPI.  Physical Exam: Vital signs BP (!) 146/97   Pulse 69   Temp 98.3 F (36.8 C)   Ht 5' 7.5" (1.715 m)   Wt 223 lb (101.2 kg)   SpO2 96%   BMI 34.41 kg/m   General:   Alert,  Well-developed, well-nourished, pleasant and cooperative in NAD Lungs:  Clear throughout to auscultation.   Heart:  Regular rate and rhythm; no murmurs, clicks, rubs,  or gallops. Abdomen:  Soft,  nontender and nondistended. Normal bowel sounds.   Neuro/Psych:  Alert and cooperative. Normal mood and affect. A  and O x 3   @Zeus Marquis  Sena Slate, MD, [facility] [phone] (pager) 12/19/2022 8:02 AM@

## 2022-12-19 NOTE — Progress Notes (Signed)
Sedate, gd SR, tolerated procedure well, VSS, report to RN 

## 2022-12-19 NOTE — Progress Notes (Signed)
Pt's states no medical or surgical changes since previsit or office visit. 

## 2022-12-20 ENCOUNTER — Telehealth: Payer: Self-pay

## 2022-12-20 NOTE — Telephone Encounter (Signed)
  Follow up Call-     12/19/2022    7:21 AM  Call back number  Post procedure Call Back phone  # [phone]  Permission to leave phone message Yes     Patient questions:  Do you have a fever, pain , or abdominal swelling? No. Pain Score  0 *  Have you tolerated food without any problems? Yes.    Have you been able to return to your normal activities? Yes.    Do you have any questions about your discharge instructions: Diet   No. Medications  No. Follow up visit  No.  Do you have questions or concerns about your Care? No.  Actions: * If pain score is 4 or above: No action needed, pain <4.

## 2022-12-23 ENCOUNTER — Encounter (HOSPITAL_COMMUNITY): Payer: Self-pay

## 2022-12-23 ENCOUNTER — Ambulatory Visit (HOSPITAL_COMMUNITY)
Admission: RE | Admit: 2022-12-23 | Discharge: 2022-12-23 | Disposition: A | Discharge status: Home / Self Care | Payer: 59 | Source: Ambulatory Visit | Attending: Medical | Admitting: Medical

## 2022-12-23 DIAGNOSIS — Z0000 Encounter for general adult medical examination without abnormal findings: Secondary | ICD-10-CM

## 2022-12-23 NOTE — Progress Notes (Signed)
Dr. Duke Salvia, see report in the event you want to see him back.  He is on statin and recent labs in chart record as well  Results sent through My Chart

## 2022-12-30 ENCOUNTER — Encounter: Payer: Self-pay | Admitting: Internal Medicine

## 2023-01-17 ENCOUNTER — Other Ambulatory Visit: Payer: Self-pay | Admitting: Medical

## 2023-01-17 NOTE — Telephone Encounter (Signed)
Pt was given a year supply on 10/2022 #90 with 3 refills.

## 2023-01-30 ENCOUNTER — Other Ambulatory Visit: Payer: Self-pay | Admitting: Medical

## 2023-02-27 ENCOUNTER — Other Ambulatory Visit (INDEPENDENT_AMBULATORY_CARE_PROVIDER_SITE_OTHER): Payer: 59

## 2023-02-27 DIAGNOSIS — Z23 Encounter for immunization: Secondary | ICD-10-CM | POA: Diagnosis not present

## 2023-04-06 ENCOUNTER — Telehealth (HOSPITAL_BASED_OUTPATIENT_CLINIC_OR_DEPARTMENT_OTHER): Payer: Self-pay

## 2023-04-06 NOTE — Telephone Encounter (Addendum)
Called patient and reviewed provider recommendations, patient scheduled with Dr. Duke Salvia.    ----- Message from Chilton Si sent at 04/02/2023  8:23 AM EDT ----- His PCP forwarded his CT report.  He has a lot of coronary, aorta and aortic valve calcification.  Recommend that he start back following up with cardiology as he is higher risk and we need to do everything possible for prevention.  Please help schedule at his convenience.  Not emergent.  THanks! ----- Message ----- From: Genia Del Sent: 12/23/2022  10:56 AM EDT To: Chilton Si, MD; [facility]  Dr. Duke Salvia, see report in the event you want to see him back.  He is on statin and recent labs in chart record as well  Results sent through My Chart

## 2023-05-23 ENCOUNTER — Ambulatory Visit: Payer: 59 | Admitting: Medical

## 2023-05-23 VITALS — BP 138/88 | HR 62 | Temp 97.2°F | Wt 236.4 lb

## 2023-05-23 DIAGNOSIS — G8929 Other chronic pain: Secondary | ICD-10-CM

## 2023-05-23 DIAGNOSIS — M25561 Pain in right knee: Secondary | ICD-10-CM

## 2023-05-23 DIAGNOSIS — M25511 Pain in right shoulder: Secondary | ICD-10-CM | POA: Diagnosis not present

## 2023-05-23 DIAGNOSIS — M25562 Pain in left knee: Secondary | ICD-10-CM

## 2023-05-23 DIAGNOSIS — M25512 Pain in left shoulder: Secondary | ICD-10-CM

## 2023-05-23 DIAGNOSIS — M25619 Stiffness of unspecified shoulder, not elsewhere classified: Secondary | ICD-10-CM | POA: Diagnosis not present

## 2023-05-23 MED ORDER — DICLOFENAC SODIUM 75 MG PO TBEC: 75.0000 mg | DELAYED_RELEASE_TABLET | Freq: Two times a day (BID) | ORAL | 0 refills | Status: DC

## 2023-05-23 MED ORDER — GABAPENTIN 100 MG PO CAPS: 200.0000 mg | ORAL_CAPSULE | Freq: Every day | ORAL | 11 refills | Status: AC

## 2023-05-23 NOTE — Patient Instructions (Signed)
Please go to [facility] for your knee xrays.   Their hours are 8am - 4:30 pm Monday - Friday.  Take your insurance card with you.  [facility] [phone]   [address] [place], [place] [address]

## 2023-05-23 NOTE — Progress Notes (Signed)
Subjective:  Troy Stevens is a 62 y.o. male who presents for Chief Complaint  Patient presents with   Knee Pain    Knee pain been going on a long time. Thinks he was given gabapentin and thinks they help relief some of the pain. Will wake up in midnight with pain     Here for knee pains.  Both knees hurt at times.   Sometimes left knee swells.  No popping or giving way.  Last week had some bad pain in right shoulder.  Has had issues with the shoulder before.  No recent injury or fall.  Denies any recent gout problems.  No recent strenuous activity or heavy lifting.  No leg or arm numbness or tingling.  No neck pain.  No other aggravating or relieving factors.    No other c/o.   Past Medical History:  Diagnosis Date   Amputation of thumb    left, s/p conveyer belt injury   Arthritis    gout   Atopic dermatitis    Diverticulosis    2013 colonoscopy, without diverticulitis   Gout    Hyperlipidemia    Hypertension    Xanthelasma of eyelid(374.51)    Current Outpatient Medications on File Prior to Visit  Medication Sig Dispense Refill   acetaminophen (TYLENOL) 325 MG tablet Take 650 mg by mouth every 6 (six) hours as needed.     allopurinol (ZYLOPRIM) 300 MG tablet TAKE 1 TABLET(300 MG) BY MOUTH DAILY 90 tablet 3   cholecalciferol (VITAMIN D3) 25 MCG (1000 UNIT) tablet Take 1 tablet (1,000 Units total) by mouth daily. 90 tablet 3   lisinopril (ZESTRIL) 20 MG tablet TAKE 1 TABLET(20 MG) BY MOUTH DAILY 90 tablet 2   rosuvastatin (CRESTOR) 20 MG tablet TAKE 1 TABLET(20 MG) BY MOUTH DAILY 90 tablet 3   No current facility-administered medications on file prior to visit.     The following portions of the patient's history were reviewed and updated as appropriate: allergies, current medications, past family history, past medical history, past social history, past surgical history and problem list.  ROS Otherwise as in subjective above  Objective: BP 138/88   Pulse 62   Temp  (!) 97.2 F (36.2 C)   Wt 236 lb 6.4 oz (107.2 kg)   BMI 36.48 kg/m   General appearance: alert, no distress, well developed, well nourished MSK: Tender of both AC joints and biceps origin, tender in upper back paraspinal, otherwise nontender, no laxity of shoulders, there is decreased external range of motion of both shoulders mild to moderate Right knee with mild laxity of ACL, bony changes of left patella, otherwise knees without tenderness to palpation, no swelling, normal range of motion Elbows and hips and ankles unremarkable Arms and legs neurovascularly intact   Assessment: Encounter Diagnoses  Name Primary?   Chronic pain of both knees Yes   Chronic pain of both shoulders    Decreased range of motion of shoulder, unspecified laterality      Plan: Chronic knee pain-begin back on gabapentin, can use Voltaren as needed, cold therapy when flared up such as pain and swelling.  Referral for updated x-ray of knees  Shoulder pains with decreased range of motion of both shoulders-likely some tendinitis and degenerative changes.  Referral to physical therapy.  I reviewed back over x-rays from 2023 of both shoulders showing some degenerative changes  Troy Stevens was seen today for knee pain.  Diagnoses and all orders for this visit:  Chronic pain of both  knees -     DG Knee Complete 4 Views Left; Future -     DG Knee Complete 4 Views Right; Future -     Ambulatory referral to Physical Therapy  Chronic pain of both shoulders -     Ambulatory referral to Physical Therapy  Decreased range of motion of shoulder, unspecified laterality -     Ambulatory referral to Physical Therapy  Other orders -     gabapentin (NEURONTIN) 100 MG capsule; Take 2 capsules (200 mg total) by mouth at bedtime. -     diclofenac (VOLTAREN) 75 MG EC tablet; Take 1 tablet (75 mg total) by mouth 2 (two) times daily.    Follow up: 65mo

## 2023-05-30 ENCOUNTER — Ambulatory Visit
Admission: RE | Admit: 2023-05-30 | Discharge: 2023-05-30 | Disposition: A | Discharge status: Home / Self Care | Payer: 59 | Source: Ambulatory Visit | Attending: Medical | Admitting: Medical

## 2023-05-30 DIAGNOSIS — G8929 Other chronic pain: Secondary | ICD-10-CM

## 2023-06-09 NOTE — Progress Notes (Signed)
Results sent through MyChart

## 2023-06-12 NOTE — Progress Notes (Signed)
 Please call as they have not reviewed my chart results

## 2023-08-24 ENCOUNTER — Encounter (HOSPITAL_BASED_OUTPATIENT_CLINIC_OR_DEPARTMENT_OTHER): Payer: Self-pay | Admitting: Cardiovascular Disease

## 2023-08-24 ENCOUNTER — Ambulatory Visit (INDEPENDENT_AMBULATORY_CARE_PROVIDER_SITE_OTHER): Payer: 59 | Admitting: Cardiovascular Disease

## 2023-08-24 VITALS — BP 142/92 | HR 59 | Ht 68.0 in | Wt 227.3 lb

## 2023-08-24 DIAGNOSIS — E7849 Other hyperlipidemia: Secondary | ICD-10-CM

## 2023-08-24 DIAGNOSIS — I10 Essential (primary) hypertension: Secondary | ICD-10-CM | POA: Diagnosis not present

## 2023-08-24 MED ORDER — ASPIRIN 81 MG PO TBEC: 81.0000 mg | DELAYED_RELEASE_TABLET | Freq: Every day | ORAL | Status: AC

## 2023-08-24 MED ORDER — OLMESARTAN MEDOXOMIL 40 MG PO TABS: 40.0000 mg | ORAL_TABLET | Freq: Every day | ORAL | 3 refills | Status: AC

## 2023-08-24 NOTE — Patient Instructions (Signed)
 Medication Instructions:  Your physician has recommended you make the following change in your medication:   Start Aspirin 81mg  daily   Stop: Lisinopril   Start: Olmesartan 40mg  daily    *If you need a refill on your cardiac medications before your next appointment, please call your pharmacy*  Lab Work: Your physician recommends that you return for lab work BMP in one week   Your physician recommends that you return for lab work in 3 months for fasting Lipid Panel, CMP & LPA  Testing/Procedures: Your Physician has requested you have a Myocardial Perfusion Imaging Study.    Please arrive 15 minutes prior to your appointment time for registration and insurance purposes.   The test will take approximately 3 to 4 hours to complete; you may bring reading material. If someone comes with you to your appointment, they will need to remain in the main lobby due to limited testing space in the testing area. **If you are pregnant or breastfeeding, please notify the nuclear lab prior to your appointment**   How to prepare for your test:  - Do not eat or drink 3 hours prior to your test, except you may have water  - Do not consume products containing caffeine ( regular or decaf) 12 hours prior to your test ( coffee, chocolate, sodas or teas)  - Do bring a list of your current medications with you. If not listed below, you may take your medications as normal (Hold beta blocker- 24 hour prior for exercise myoview)   - Do wear comfortable clothes (no dresses or overalls) and walking shoes ( tennis shoes preferred), no heel or open toe shoes are allowed - Do not wear cologne, perfume, aftershave, or lotions ( deodorant is allowed)  - If these instructions are not followed, your test will be rescheduled   If you cannot keep your appointment, please provide 24 hours notice to the Nuclear Lab, to avoid a possible $50 charge to your account!   Follow-Up: At [facility], you and your health  needs are our priority.  As part of our continuing mission to provide you with exceptional heart care, we have created designated Provider Care Teams.  These Care Teams include your primary Cardiologist (physician) and Advanced Practice Providers (APPs -  Physician Assistants and Nurse Practitioners) who all work together to provide you with the care you need, when you need it.  We recommend signing up for the patient portal called "MyChart".  Sign up information is provided on this After Visit Summary.  MyChart is used to connect with patients for Virtual Visits (Telemedicine).  Patients are able to view lab/test results, encounter notes, upcoming appointments, etc.  Non-urgent messages can be sent to your provider as well.   To learn more about what you can do with MyChart, go to [email or web address].    Your next appointment:   3 months with Dr. Duke Salvia

## 2023-08-24 NOTE — Progress Notes (Signed)
 Cardiology Office Note:  .   Date:  08/24/2023  ID:  Troy Stevens, DOB 1960/07/29, MRN [id number] PCP: Genia Del  [facility] Providers Cardiologist:  None    History of Present Illness: Troy Stevens is a 63 y.o. male with hypertension, hyperlipidemia, prior tobacco abuse, and obesity who presents to re-establish care.  He was seen 03/2015 evaluation of an abnormal EKG.  Troy Stevens saw Crosby Oyster, PA-C, on 03/02/15 for a physical exam.  At that appointment an EKG was obtained that showed sinus bradycardia with a fusion complex and a PAC.  There was concern for a strain pattern, so he was referred to cardiology for further evaluation.  He was feeling well and had no cardiovascular complaints.  He was encouraged to quit smoking and no other evaluation was needed at the time.  He followed up with Corine Shelter, PA-C/2021 and was doing well.   Troy Stevens saw Crosby Oyster, PA-C and had a coronary calcium score which revealed a score of 939 (98th percentile) and aortic atherosclerosis.  There was also aortic valve calcification.   Troy Stevens reports that he has been feeing well.  He has no chest pain, pressure, or breathing difficulties during physical activities.  He walks a lot at work and started doing some Emergency planning/management officer but doesn't get much cardio outside of workl.  He experiences excessive sweating, even after showering and applying lotion, which has been a long-standing issue.  He is active at work, walking around the store and climbing stairs multiple times a day. He recently started using a cardio machine but had to skip a day due to soreness and nausea. He has made recent lifestyle changes, including increasing physical activity and improving his diet by reducing junk food and incorporating healthier options like boiled chicken and nuts. He finds it challenging, especially at work where there is temptation from others eating candy. He mostly cooks at home,  often preparing chicken dishes.  He has a history of smoking but has significantly reduced his intake to one cigarette a week, sometimes two or three. He smoked one cigarette the day before the visit.  He has a history of joint pain, particularly in his knees and ankles, for which he attends physical therapy and chiropractic sessions. He describes his ankles as 'bad for years' and mentions that his feet start to hurt after walking for a while. No leg pain when walking but mentions joint pain in his knees and ankles.  He mentions having a blood pressure machine at home but does not regularly check his blood pressure. He recalls being on blood pressure medication previously but stopped due to side effects like cramps. He also notes a history of taking aspirin but was advised to stop by a previous provider.     ROS:  As per HPI  Studies Reviewed: Marland Kitchen   EKG Interpretation Date/Time:  Thursday August 24 2023 08:18:33 EDT Ventricular Rate:  62 PR Interval:  162 QRS Duration:  100 QT Interval:  422 QTC Calculation: 428 R Axis:   77  Text Interpretation: Normal sinus rhythm Normal ECG When compared with ECG of 16-Jan-2013 11:20, Questionable change in QRS axis T wave inversion no longer evident in Inferior leads Confirmed by Chilton Si ([id number]) on 08/24/2023 8:25:19 AM     Risk Assessment/Calculations:     HYPERTENSION CONTROL Vitals:   08/24/23 0810 08/24/23 0845  BP: (!) 140/92 (!) 142/92    The patient's blood pressure  is elevated above target today.  In order to address the patient's elevated BP: A current anti-hypertensive medication was adjusted today.     Physical Exam:   VS:  BP (!) 142/92   Pulse (!) 59   Ht 5\' 8"  (1.727 m)   Wt 227 lb 4.8 oz (103.1 kg)   BMI 34.56 kg/m  , BMI Body mass index is 34.56 kg/m. GENERAL:  Well appearing HEENT: Pupils equal round and reactive, fundi not visualized, oral mucosa unremarkable NECK:  No jugular venous distention, waveform  within normal limits, carotid upstroke brisk and symmetric, no bruits, no thyromegaly LUNGS:  Clear to auscultation bilaterally HEART:  RRR.  PMI not displaced or sustained,S1 and S2 within normal limits, no S3, no S4, no clicks, no rubs, no murmurs ABD:  Flat, positive bowel sounds normal in frequency in pitch, no bruits, no rebound, no guarding, no midline pulsatile mass, no hepatomegaly, no splenomegaly EXT:  2 plus pulses throughout, no edema, no cyanosis no clubbing SKIN:  No rashes no nodules NEURO:  Cranial nerves II through XII grossly intact, motor grossly intact throughout PSYCH:  Cognitively intact, oriented to person place and time   ASSESSMENT AND PLAN: .    # Coronary Artery Disease CT scan shows coronary artery calcification in the 98th percentile, indicating increased risk. Further evaluation needed to assess obstruction risk.  He is active but not exercising vigorously.   - Order exercise Myoview to assess coronary artery blood flow and potential obstruction. - Initiate baby aspirin 81 mg daily to reduce cardiovascular event risk.  # Hyperlipidemia LDL cholesterol improved from 154 mg/dL to 87 mg/dL with statin therapy. Goal is to reduce LDL to below 70 mg/dL. Emphasized lifestyle modifications to achieve this goal. - Encourage continuation of dietary changes, focusing on reducing red meat and animal products. - Encourage increased physical activity, including cardio exercises, to lower LDL cholesterol. - Re-evaluate lipid panel and CMP in three months.  # Hypertension Blood pressure slightly elevated. Switched from lisinopril to olmesartan to avoid cough and improve blood pressure control. Considered potential angioedema risk with lisinopril. - Discontinue lisinopril due to cough and potential angioedema risk. - Initiate olmesartan 40 mg daily for improved blood pressure control. - Order basic metabolic panel in one week to monitor renal function and electrolytes. -  Re-evaluate blood pressure in three months.  # Smoking Cessation Reduced smoking to less than one cigarette per day but has not quit. Discussed importance of cessation for cardiovascular health. - Encourage complete smoking cessation and discuss potential use of nicotine replacement therapy if needed.  # Follow-up Plan to monitor progress with lifestyle changes and medication adjustments. Emphasized importance of repeat lab work to evaluate lipid levels and other parameters. - Schedule follow-up appointment in three months to reassess cardiovascular risk factors and medication efficacy. - Order fasting lab work one week before follow-up to evaluate lipid levels and other parameters.         Dispo: f/u in 3 months  Signed, Chilton Si, MD

## 2023-08-28 ENCOUNTER — Encounter (HOSPITAL_COMMUNITY)

## 2023-09-04 LAB — BASIC METABOLIC PANEL WITH GFR
BUN/Creatinine Ratio: 17 (ref 10–24)
BUN: 17 mg/dL (ref 8–27)
CO2: 23 mmol/L (ref 20–29)
Calcium: 9.6 mg/dL (ref 8.6–10.2)
Chloride: 103 mmol/L (ref 96–106)
Creatinine, Ser: 1 mg/dL (ref 0.76–1.27)
Glucose: 101 mg/dL — ABNORMAL HIGH (ref 70–99)
Potassium: 4.7 mmol/L (ref 3.5–5.2)
Sodium: 141 mmol/L (ref 134–144)
eGFR: 85 mL/min/{1.73_m2}

## 2023-09-05 ENCOUNTER — Encounter (HOSPITAL_BASED_OUTPATIENT_CLINIC_OR_DEPARTMENT_OTHER): Payer: Self-pay

## 2023-09-22 ENCOUNTER — Encounter (HOSPITAL_COMMUNITY): Payer: Self-pay

## 2023-09-26 ENCOUNTER — Telehealth (HOSPITAL_COMMUNITY): Payer: Self-pay | Admitting: *Deleted

## 2023-09-26 NOTE — Telephone Encounter (Signed)
 Patient was not available. Left instructions with his wife per DPR. He has a STRESS TEST scheduled for 09/29/23 at 7:45.

## 2023-09-29 ENCOUNTER — Ambulatory Visit (HOSPITAL_COMMUNITY): Attending: Cardiology

## 2023-09-29 DIAGNOSIS — R079 Chest pain, unspecified: Secondary | ICD-10-CM | POA: Insufficient documentation

## 2023-09-29 DIAGNOSIS — I2510 Atherosclerotic heart disease of native coronary artery without angina pectoris: Secondary | ICD-10-CM | POA: Insufficient documentation

## 2023-09-29 DIAGNOSIS — I10 Essential (primary) hypertension: Secondary | ICD-10-CM | POA: Diagnosis present

## 2023-09-29 LAB — MYOCARDIAL PERFUSION IMAGING
LV dias vol: 101 mL (ref 62–150)
LV sys vol: 40 mL
Nuc Stress EF: 60 %
Peak HR: 121 {beats}/min
Rest HR: 56 {beats}/min
Rest Nuclear Isotope Dose: 10.7 mCi
SDS: 2
SRS: 0
SSS: 2
ST Depression (mm): 0 mm
Stress Nuclear Isotope Dose: 31.2 mCi
TID: 0.9

## 2023-09-29 MED ORDER — TECHNETIUM TC 99M TETROFOSMIN IV KIT
10.7000 | PACK | Freq: Once | INTRAVENOUS | Status: AC | PRN
  Administered 2023-09-29: 10.7 via INTRAVENOUS

## 2023-09-29 MED ORDER — TECHNETIUM TC 99M TETROFOSMIN IV KIT
31.2000 | PACK | Freq: Once | INTRAVENOUS | Status: AC | PRN
Start: 2023-09-29 — End: 2023-09-29
  Administered 2023-09-29: 31.2 via INTRAVENOUS

## 2023-09-29 MED ORDER — REGADENOSON 0.4 MG/5ML IV SOLN
0.4000 mg | Freq: Once | INTRAVENOUS | Status: AC
Start: 2023-09-29 — End: 2023-09-29
  Administered 2023-09-29: 0.4 mg via INTRAVENOUS

## 2023-10-18 ENCOUNTER — Encounter (HOSPITAL_BASED_OUTPATIENT_CLINIC_OR_DEPARTMENT_OTHER): Payer: Self-pay

## 2023-10-19 ENCOUNTER — Telehealth (HOSPITAL_BASED_OUTPATIENT_CLINIC_OR_DEPARTMENT_OTHER): Payer: Self-pay | Admitting: *Deleted

## 2023-10-19 NOTE — Telephone Encounter (Signed)
-----   Message from [facility] sent at 10/18/2023 11:29 AM EDT ----- Stress test is low risk.  Blood pressure was very elevated that day.  Can we please just check in and make sure that his blood pressures have stabilized and are staying less than 130/80.  If not he will need follow-up with either me, APP, or Pharm.D.

## 2023-10-19 NOTE — Telephone Encounter (Signed)
 Tried to call patient since mychart message not read, no VM set up

## 2023-10-20 NOTE — Telephone Encounter (Signed)
 Spoke with patient and his blood pressure has been ok, was nervous when he went for test Advised to check blood pressure 1 to 2 times a day 1 to 2 hours after taking Olmesartan  and call the office if not consistently below 130/80 Patient verbalized understanding

## 2023-10-24 ENCOUNTER — Ambulatory Visit (HOSPITAL_BASED_OUTPATIENT_CLINIC_OR_DEPARTMENT_OTHER): Payer: Self-pay

## 2023-10-30 ENCOUNTER — Ambulatory Visit (INDEPENDENT_AMBULATORY_CARE_PROVIDER_SITE_OTHER): Admitting: Medical

## 2023-10-30 VITALS — BP 130/70 | HR 64 | Ht 68.0 in | Wt 224.6 lb

## 2023-10-30 DIAGNOSIS — Z0000 Encounter for general adult medical examination without abnormal findings: Secondary | ICD-10-CM | POA: Diagnosis not present

## 2023-10-30 DIAGNOSIS — I10 Essential (primary) hypertension: Secondary | ICD-10-CM

## 2023-10-30 DIAGNOSIS — E559 Vitamin D deficiency, unspecified: Secondary | ICD-10-CM

## 2023-10-30 DIAGNOSIS — N400 Enlarged prostate without lower urinary tract symptoms: Secondary | ICD-10-CM

## 2023-10-30 DIAGNOSIS — H0260 Xanthelasma of unspecified eye, unspecified eyelid: Secondary | ICD-10-CM

## 2023-10-30 DIAGNOSIS — Z125 Encounter for screening for malignant neoplasm of prostate: Secondary | ICD-10-CM

## 2023-10-30 DIAGNOSIS — R7301 Impaired fasting glucose: Secondary | ICD-10-CM

## 2023-10-30 DIAGNOSIS — I2510 Atherosclerotic heart disease of native coronary artery without angina pectoris: Secondary | ICD-10-CM | POA: Insufficient documentation

## 2023-10-30 DIAGNOSIS — E785 Hyperlipidemia, unspecified: Secondary | ICD-10-CM

## 2023-10-30 DIAGNOSIS — Z1159 Encounter for screening for other viral diseases: Secondary | ICD-10-CM

## 2023-10-30 LAB — LIPID PANEL

## 2023-10-30 NOTE — Progress Notes (Signed)
 Subjective:   HPI  Troy Stevens is a 63 y.o. male who presents for Chief Complaint  Patient presents with   Annual Exam    Fasting cpe, knee pain. See chiropractor but not helping.     Patient Care Team: Ayden Apodaca, Christiane Cowing, PA-C as PCP - General (Family Medicine) Radene Buffalo, DPM as Consulting Physician (Podiatry) Kenney Peacemaker, MD as Consulting Physician (Gastroenterology) Maudine Sos, MD as Attending Physician (Cardiology) Lyanne Sample, MD as Consulting Physician (Orthopedic Surgery) Celia Coles Angus Kenning, DPM as Consulting Physician (Podiatry)  Concerns: Here for well visit  Has chronic joint pain, knee pain.  He is seeing a chiropractor currently  He had a stress test recently in April.  He had a colonoscopy this past year  HTN - compliant with medication, olmesartan  40mg  daily.   No cp, no DOE.    Hyperlipemia - compliant with medicaiton, aspirin  81mg  and crestor  20mg  daily without side effects  Compliant with allopurinol  300mg  daily  Compliant with gabapentin  200mg  daily for pain in legs, numbness.    Has ongoing problems with hemorrhoids from time to time.  He notes today as a 2-3 bowel movements a day.  With his morning bowel movement he does sit on the toilet for a little while, extended time.  He notes that he gets a little boil and it drains every now and then next to the anus  Reviewed their medical, surgical, family, social, medication, and allergy history and updated chart as appropriate.  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 Repeat 2023, Dr. 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   TENDON EXPLORATION Right 01/16/2013   Procedure: TENDON EXPLORATION AND REPAIRS RIGHT RING AND SMALL FINGERS ;  Surgeon: Kemp Patter, MD;  Location: [facility];  Service: Orthopedics;  Laterality: Right;   THUMB AMPUTATION Left     Family History  Problem Relation Age of Onset   Other Mother        died in MVA   Other Father        died in MVA   Hypertension Sister    Heart disease Sister    Cancer Sister        cancer   Arthritis Brother    Hypertension Brother    Gout Brother    Hypertension Brother    Heart disease Brother    Kidney disease Brother    Hyperlipidemia Paternal Aunt    Diabetes Maternal Grandfather    Stroke Paternal Grandfather    Colon cancer Neg Hx    Stomach cancer Neg Hx    Colon polyps Neg Hx    Esophageal cancer Neg Hx    Rectal cancer Neg Hx      Current Outpatient Medications:    acetaminophen  (TYLENOL ) 325 MG tablet, Take 650 mg  by mouth every 6 (six) hours as needed., Disp: , Rfl:    allopurinol  (ZYLOPRIM ) 300 MG tablet, TAKE 1 TABLET(300 MG) BY MOUTH DAILY, Disp: 90 tablet, Rfl: 3   aspirin  EC 81 MG tablet, Take 1 tablet (81 mg total) by mouth daily. Swallow whole., Disp: , Rfl:    cholecalciferol (VITAMIN D3) 25 MCG (1000 UNIT) tablet, Take 1 tablet (1,000 Units total) by mouth daily., Disp: 90 tablet, Rfl: 3   gabapentin  (NEURONTIN ) 100 MG capsule, Take 2 capsules (200 mg total) by mouth at bedtime., Disp: 60 capsule, Rfl: 11   olmesartan  (BENICAR ) 40 MG tablet, Take 1 tablet (40 mg total) by mouth daily., Disp: 90 tablet, Rfl: 3   rosuvastatin  (CRESTOR ) 20 MG tablet, TAKE 1 TABLET(20 MG) BY MOUTH DAILY, Disp: 90 tablet, Rfl: 3  Allergies  Allergen Reactions   Amlodipine      Ankle edema    Review of Systems  Constitutional:   Negative for chills, fever, malaise/fatigue and weight loss.  HENT:  Negative for congestion, ear pain, hearing loss, sore throat and tinnitus.   Eyes:  Negative for blurred vision, pain and redness.  Respiratory:  Negative for cough, hemoptysis and shortness of breath.   Cardiovascular:  Negative for chest pain, palpitations, orthopnea, claudication and leg swelling.  Gastrointestinal:  Negative for abdominal pain, blood in stool, constipation, diarrhea, nausea and vomiting.  Genitourinary:  Negative for dysuria, flank pain, frequency, hematuria and urgency.  Musculoskeletal:  Positive for joint pain. Negative for falls and myalgias.  Skin:  Negative for itching and rash.  Neurological:  Negative for dizziness, tingling, speech change, weakness and headaches.  Endo/Heme/Allergies:  Negative for polydipsia. Does not bruise/bleed easily.  Psychiatric/Behavioral:  Negative for depression and memory loss. The patient is not nervous/anxious and does not have insomnia.         Objective:  BP 130/70   Pulse 64   Ht 5\' 8"  (1.727 m)   Wt 224 lb 9.6 oz (101.9 kg)   BMI 34.15 kg/m   Wt Readings from Last 3 Encounters:  10/30/23 224 lb 9.6 oz (101.9 kg)  09/29/23 227 lb (103 kg)  08/24/23 227 lb 4.8 oz (103.1 kg)   BP Readings from Last 3 Encounters:  10/30/23 130/70  08/24/23 (!) 142/92  05/23/23 138/88     Physical Exam Vitals and nursing note reviewed.  Constitutional:      General: He is not in acute distress.    Appearance: Normal appearance. He is not ill-appearing.  HENT:     Head: Normocephalic and atraumatic.     Right Ear: External ear normal.     Left Ear: External ear normal.     Nose: Nose normal.     Mouth/Throat:     Mouth: Mucous membranes are moist.     Pharynx: Oropharynx is clear.  Eyes:     Extraocular Movements: Extraocular movements intact.     Conjunctiva/sclera: Conjunctivae normal.     Pupils: Pupils are equal, round, and reactive to light.      Comments: Arcus senilius bilat   Neck:     Vascular: No carotid bruit.  Cardiovascular:     Rate and Rhythm: Normal rate and regular rhythm.     Pulses: Normal pulses.     Heart sounds: Normal heart sounds.  Pulmonary:     Effort: Pulmonary effort is normal.     Breath sounds: No wheezing, rhonchi or rales.     Comments: Decreased breath sounds in general Abdominal:  General: Bowel sounds are normal. There is no distension.     Palpations: Abdomen is soft. There is no mass.     Tenderness: There is no abdominal tenderness.     Hernia: No hernia is present.  Genitourinary:    Comments: Small external hemorrhoid today, right side just distal to the anus with some scarring from likely prior perirectal abscess Musculoskeletal:        General: Deformity present. No swelling or tenderness.     Cervical back: Normal range of motion and neck supple. No tenderness.     Right lower leg: No edema.     Left lower leg: No edema.     Comments: Status post left thumb amputation,tophi of bilat elbows and knees,scarring up hand to forearm from prior injury, bilateral shoulder external rotation decreased, no specfiic trigger finger or nodule of either index finger, mild tendnerss over bilat patellar tendiniits,  but otherwise no laxity no swelling no other deformity  Lymphadenopathy:     Cervical: No cervical adenopathy.  Skin:    General: Skin is warm and dry.     Capillary Refill: Capillary refill takes less than 2 seconds.  Neurological:     General: No focal deficit present.     Mental Status: He is alert and oriented to person, place, and time. Mental status is at baseline.     Cranial Nerves: No cranial nerve deficit.     Sensory: No sensory deficit.     Motor: No weakness.     Gait: Gait normal.     Deep Tendon Reflexes: Reflexes normal.     Comments: Normal strength and sensation of arms  Psychiatric:        Mood and Affect: Mood normal.        Behavior: Behavior normal.         Judgment: Judgment normal.      Assessment and Plan :   Encounter Diagnoses  Name Primary?   Encounter for health maintenance examination in adult Yes   Essential hypertension    Vitamin D  deficiency    Xanthelasma    Prostate enlargement    Impaired fasting blood sugar    Hyperlipidemia, unspecified hyperlipidemia type    Coronary artery disease involving native coronary artery of native heart without angina pectoris    Screening for prostate cancer    Encounter for hepatitis C screening test for low risk patient      Today you had a preventative care visit or wellness visit.    Topics today may have included healthy lifestyle, diet, exercise, preventative care, vaccinations, sick and well care, proper use of emergency dept and after hours care, as well as other concerns.     Recommendations: Continue to return yearly for your annual wellness and preventative care visits.  This gives us  a chance to discuss healthy lifestyle, exercise, vaccinations, review your chart record, and perform screenings where appropriate.  I recommend you see your eye doctor yearly for routine vision care.  I recommend you see your dentist yearly for routine dental care including hygiene visits twice yearly.   Vaccination recommendations were reviewed Immunization History  Administered Date(s) Administered   Influenza, Seasonal, Injecte, Preservative Fre 02/27/2023   Influenza,inj,Quad PF,6+ Mos 03/02/2015, 05/12/2016, 03/21/2017, 05/07/2018, 04/08/2019, 03/19/2020, 03/21/2022   Moderna Covid-19 Vaccine Bivalent Booster 39yrs & up 08/16/2021   Moderna Sars-Covid-2 Vaccination 09/19/2019, 10/17/2019, 06/09/2020   Pfizer(Comirnaty)Fall Seasonal Vaccine 12 years and older 02/27/2023   Pneumococcal Polysaccharide-23 03/02/2015, 07/16/2020   Td  10/22/2021   Tdap 12/13/2011   Zoster Recombinant(Shingrix) 11/23/2020, 03/05/2021     Screening for cancer: Colon cancer screening:  I reviewed 12/2022  colonoscopy report, with severe diverticulosis in sigmoid colon, enlarged prostate.  Advised repeat 2034.  We discussed PSA, prostate exam, and prostate cancer screening risks/benefits.     Skin cancer screening: Check your skin regularly for new changes, growing lesions, or other lesions of concern Come in for evaluation if you have skin lesions of concern.  Lung cancer screening: If you have a greater than 30 pack year history of tobacco use, then you qualify for lung cancer screening with a chest CT scan  We currently don't have screenings for other cancers besides breast, cervical, colon, and lung cancers.  If you have a strong family history of cancer or have other cancer screening concerns, please let me know.    Bone health: Get at least 150 minutes of aerobic exercise weekly Get weight bearing exercise at least once weekly   Heart health: Get at least 150 minutes of aerobic exercise weekly Limit alcohol It is important to maintain a healthy blood pressure and healthy diet numbers  C/t follow up with cardiology  CT coronary test 12/2022 showed score of 939, CAD, aortic atherosclerosis, aortic valve calcification  I reviewed the stress test results he had from April 2025.  Normal study, low risk, study was converted from exercise protocol to pharmacological due to elevated blood pressure.  LV perfusion was normal.  No evidence of ischemia.  LV ejection fraction 55 to 65%   Separate significant issues discussed: Hypertension-continue Olmesartan  40 mg daily  Hyperlipidemia-routine labs today, continue Crestor  20 mg daily  History of gout and tophi-continue allopurinol  300mg  daily for prevention  Vitamin D  deficiency-updated labs today.  He is currently taking 1000 units daily vitamin D  supplement  External hemorrhoids-advised less time on the potty, with straining, can use hot bath soak a steroid cream for flareup of hemorrhoids.  There is also concern for prior possible  perirectal abscess.  I will add some additional labs today in that regard.    Doris was seen today for annual exam.  Diagnoses and all orders for this visit:  Encounter for health maintenance examination in adult -     Comprehensive metabolic panel with GFR -     CBC -     Lipid panel -     Hemoglobin A1c -     PSA -     HIV Antibody (routine testing w rflx) -     Hepatitis C antibody  Essential hypertension  Vitamin D  deficiency  Xanthelasma  Prostate enlargement  Impaired fasting blood sugar -     Hemoglobin A1c  Hyperlipidemia, unspecified hyperlipidemia type -     Lipid panel  Coronary artery disease involving native coronary artery of native heart without angina pectoris  Screening for prostate cancer -     PSA  Encounter for hepatitis C screening test for low risk patient -     HIV Antibody (routine testing w rflx) -     Hepatitis C antibody     Follow-up pending labs, yearly for physical

## 2023-10-31 ENCOUNTER — Ambulatory Visit: Payer: Self-pay | Admitting: Medical

## 2023-10-31 ENCOUNTER — Other Ambulatory Visit: Payer: Self-pay | Admitting: Medical

## 2023-10-31 LAB — COMPREHENSIVE METABOLIC PANEL WITH GFR
ALT: 24 IU/L (ref 0–44)
AST: 22 IU/L (ref 0–40)
Albumin: 4.7 g/dL (ref 3.9–4.9)
Alkaline Phosphatase: 49 IU/L (ref 44–121)
BUN/Creatinine Ratio: 20 (ref 10–24)
BUN: 22 mg/dL (ref 8–27)
Bilirubin Total: 0.4 mg/dL (ref 0.0–1.2)
CO2: 20 mmol/L (ref 20–29)
Calcium: 9.5 mg/dL (ref 8.6–10.2)
Chloride: 104 mmol/L (ref 96–106)
Creatinine, Ser: 1.11 mg/dL (ref 0.76–1.27)
Globulin, Total: 2.1 g/dL (ref 1.5–4.5)
Glucose: 95 mg/dL (ref 70–99)
Potassium: 4.3 mmol/L (ref 3.5–5.2)
Sodium: 139 mmol/L (ref 134–144)
Total Protein: 6.8 g/dL (ref 6.0–8.5)
eGFR: 75 mL/min/{1.73_m2} (ref >59)

## 2023-10-31 LAB — LIPID PANEL
Chol/HDL Ratio: 2.6 ratio (ref 0.0–5.0)
Cholesterol, Total: 137 mg/dL (ref 100–199)
HDL: 52 mg/dL (ref >39)
LDL Chol Calc (NIH): 70 mg/dL (ref 0–99)
Triglycerides: 73 mg/dL (ref 0–149)
VLDL Cholesterol Cal: 15 mg/dL (ref 5–40)

## 2023-10-31 LAB — PSA: Prostate Specific Ag, Serum: 1 ng/mL (ref 0.0–4.0)

## 2023-10-31 LAB — CBC
Hematocrit: 44.1 % (ref 37.5–51.0)
Hemoglobin: 14.7 g/dL (ref 13.0–17.7)
MCH: 32 pg (ref 26.6–33.0)
MCHC: 33.3 g/dL (ref 31.5–35.7)
MCV: 96 fL (ref 79–97)
Platelets: 247 10*3/uL (ref 150–450)
RBC: 4.59 x10E6/uL (ref 4.14–5.80)
RDW: 13.1 % (ref 11.6–15.4)
WBC: 6 10*3/uL (ref 3.4–10.8)

## 2023-10-31 LAB — HIV ANTIBODY (ROUTINE TESTING W REFLEX): HIV Screen 4th Generation wRfx: NONREACTIVE

## 2023-10-31 LAB — HEMOGLOBIN A1C
Est. average glucose Bld gHb Est-mCnc: 117 mg/dL
Hgb A1c MFr Bld: 5.7 % — ABNORMAL HIGH (ref 4.8–5.6)

## 2023-10-31 LAB — HEPATITIS C ANTIBODY: Hep C Virus Ab: NONREACTIVE

## 2023-10-31 MED ORDER — RESTORA PO CAPS: 1.0000 | ORAL_CAPSULE | Freq: Every day | ORAL | 0 refills | Status: DC

## 2023-10-31 MED ORDER — ROSUVASTATIN CALCIUM 20 MG PO TABS: ORAL_TABLET | ORAL | 3 refills | Status: DC

## 2023-10-31 MED ORDER — VITAMIN D 25 MCG (1000 UNIT) PO TABS: 1000.0000 [IU] | ORAL_TABLET | Freq: Every day | ORAL | 3 refills | Status: AC

## 2023-10-31 MED ORDER — ALLOPURINOL 300 MG PO TABS: ORAL_TABLET | ORAL | 3 refills | Status: AC

## 2023-10-31 NOTE — Progress Notes (Signed)
 Results sent through MyChart

## 2023-11-03 ENCOUNTER — Encounter: Payer: 59 | Admitting: Medical

## 2023-11-07 ENCOUNTER — Encounter: Payer: 59 | Admitting: Medical

## 2023-11-08 ENCOUNTER — Ambulatory Visit (INDEPENDENT_AMBULATORY_CARE_PROVIDER_SITE_OTHER): Admitting: Medical

## 2023-11-08 VITALS — BP 110/80 | HR 68 | Temp 98.0°F | Wt 227.2 lb

## 2023-11-08 DIAGNOSIS — M25511 Pain in right shoulder: Secondary | ICD-10-CM

## 2023-11-08 DIAGNOSIS — M25551 Pain in right hip: Secondary | ICD-10-CM

## 2023-11-08 DIAGNOSIS — W19XXXA Unspecified fall, initial encounter: Secondary | ICD-10-CM

## 2023-11-08 DIAGNOSIS — M25651 Stiffness of right hip, not elsewhere classified: Secondary | ICD-10-CM | POA: Diagnosis not present

## 2023-11-08 DIAGNOSIS — M25512 Pain in left shoulder: Secondary | ICD-10-CM

## 2023-11-08 DIAGNOSIS — M1A09X0 Idiopathic chronic gout, multiple sites, without tophus (tophi): Secondary | ICD-10-CM

## 2023-11-08 NOTE — Progress Notes (Signed)
 Placed referral

## 2023-11-08 NOTE — Patient Instructions (Signed)
 Hip pain, decreased range of motion of hip  You can use over the counter tylenol  325mg  or 500mg  twice daily for pain You can use short term some over the counter Aleve 1 tablets once or twice daily this week Use stretching and circular range of motion exercises like we discussed Consider cane if feeling like hip is going to give out We have placed referral today for sports medicine

## 2023-11-08 NOTE — Progress Notes (Signed)
 Tried to call [facility] but unable to reach anyone Will call again later [phone]

## 2023-11-08 NOTE — Addendum Note (Signed)
 Addended by: Charliene Conte A on: 11/08/2023 04:53 PM   Modules accepted: Orders

## 2023-11-08 NOTE — Progress Notes (Signed)
 Subjective:  Troy Stevens is a 63 y.o. male who presents for Chief Complaint  Patient presents with   Acute Visit    Right upper hip pain. Ongoing for a couple weeks/months     Here for right hip pain and fall.  Been having right hip for few months intermittent.   Last 2 weeks steady pain.  Feel once recently given the pain and weakness of right hip.  Almost fell a few other times.  No other recent injury or trauma.  He is compliant with medication including gout prevention medication.   Sees PT already for knees and ankles  Also having some pains in shoulders this past week.   He did eat some ribs, hamburgers and hotdogs this past week over holiday weekend.   Sees PT, Troy Stevens at [facility].  No leg or arm numbness or tingling.   No other aggravating or relieving factors.    No other c/o.  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     allopurinol  (ZYLOPRIM ) 300 MG tablet TAKE 1 TABLET(300 MG) BY MOUTH DAILY 90 tablet 3   aspirin  EC 81 MG tablet Take 1 tablet (81 mg total) by mouth daily. Swallow whole.     cholecalciferol (VITAMIN D3) 25 MCG (1000 UNIT) tablet Take 1 tablet (1,000 Units total) by mouth daily. 90 tablet 3   gabapentin  (NEURONTIN ) 100 MG capsule Take 2 capsules (200 mg total) by mouth at bedtime. 60 capsule 11   olmesartan  (BENICAR ) 40 MG tablet Take 1 tablet (40 mg total) by mouth daily. 90 tablet 3   Probiotic Product (RESTORA) CAPS Take 1 capsule by mouth daily. 90 capsule 0   rosuvastatin  (CRESTOR ) 20 MG tablet TAKE 1 TABLET(20 MG) BY MOUTH  DAILY 90 tablet 3   No current facility-administered medications on file prior to visit.     The following portions of the patient's history were reviewed and updated as appropriate: allergies, current medications, past family history, past medical history, past social history, past surgical history and problem list.  ROS Otherwise as in subjective above  Objective: BP 110/80   Pulse 68   Temp 98 F (36.7 C)   Wt 227 lb 3.2 oz (103.1 kg)   SpO2 97%   BMI 34.55 kg/m   General appearance: alert, no distress, well developed, well nourished MSK: tender over right hip, decreased internal ROM of right hip, no swelling, no trochanter tendnerss, otherwise leg nontender.seems to have some pain with walking and squatting.  Legs neurovascularly intact    Assessment: Encounter Diagnoses  Name Primary?   Right hip pain Yes   Decreased range of right hip movement    Chronic gout of multiple sites, unspecified cause    Bilateral shoulder pain, unspecified chronicity    Fall, initial encounter      Plan: Hip pain, fall, decreased internal right hip ROM  Referral to sports medicine We will call PT since he is currently in therapy to add right hip and bilat shoulder  pains for PT In the meantime can use OTC tylenol  325mg  or 500mg  BID along with aleve alternating q4-6 hours, Aleve BID the next 5- 7 days. I demonstrated some ROM and stretching for him to do with the hip Consider cane to help with stability currently  Bilat shoulder pain - continue with PT  Chronic gout - likely has flared up his gout a little with diet indiscretion over the recent holiday weekend   Troy Stevens was seen today for acute visit.  Diagnoses and all orders for this visit:  Right hip pain -     Ambulatory referral to Sports Medicine  Decreased range of right hip movement -     Ambulatory referral to Sports Medicine  Chronic gout of multiple sites, unspecified cause -     Ambulatory referral to Sports  Medicine  Bilateral shoulder pain, unspecified chronicity -     Ambulatory referral to Sports Medicine  Fall, initial encounter    Follow up: pending referral and PT

## 2023-11-08 NOTE — Progress Notes (Signed)
 [facility] will have to have a new referral to create a new case for his hip and shoulder pain

## 2023-11-14 ENCOUNTER — Encounter: Payer: Self-pay | Admitting: Family Medicine

## 2023-11-14 ENCOUNTER — Ambulatory Visit (INDEPENDENT_AMBULATORY_CARE_PROVIDER_SITE_OTHER): Admitting: Family Medicine

## 2023-11-14 VITALS — BP 138/84 | Ht 68.0 in | Wt 222.0 lb

## 2023-11-14 DIAGNOSIS — M25551 Pain in right hip: Secondary | ICD-10-CM | POA: Diagnosis not present

## 2023-11-14 DIAGNOSIS — M25511 Pain in right shoulder: Secondary | ICD-10-CM

## 2023-11-14 DIAGNOSIS — M25512 Pain in left shoulder: Secondary | ICD-10-CM | POA: Diagnosis not present

## 2023-11-14 MED ORDER — MELOXICAM 15 MG PO TABS: 15.0000 mg | ORAL_TABLET | Freq: Every day | ORAL | 0 refills | Status: DC

## 2023-11-14 NOTE — Progress Notes (Signed)
 PCP: Claudene Crystal, PA-C  Chief Complaint: Right hip pain Subjective:   HPI: Patient is a 63 y.o. male here for worsening hip pain has been coming and going for the past few months now.  Patient notes that the pain is more prevalent and is now more likely to stay than before.  Patient states that he does not have any numbness or tingling in his hip and that the pain is all located on the anterior portion.  Patient states the pain is worse when he has been walking for long periods of time..  Patient states that the pain feels deep in his inguinal region.  Patient has no known injury.   Patient also notes that his been dealing with bilateral shoulder pain that sometimes radiates down all the way into his hands.  Patient also notes some neck pain.  Patient states that the pain is not present currently but he is also noticed that it comes and goes.   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     allopurinol  (ZYLOPRIM ) 300 MG tablet TAKE 1 TABLET(300 MG) BY MOUTH DAILY 90 tablet 3   aspirin  EC 81 MG tablet Take 1 tablet (81 mg total) by mouth daily. Swallow whole.     cholecalciferol (VITAMIN D3) 25 MCG (1000 UNIT) tablet Take 1 tablet (1,000 Units total) by mouth daily. 90 tablet 3   gabapentin  (NEURONTIN ) 100 MG capsule Take 2 capsules (200 mg total) by mouth at bedtime. 60 capsule 11   olmesartan  (BENICAR ) 40 MG tablet Take 1 tablet (40 mg total) by mouth daily. 90 tablet 3   Probiotic Product (RESTORA) CAPS Take 1 capsule by mouth daily. 90 capsule 0   rosuvastatin  (CRESTOR )  20 MG tablet TAKE 1 TABLET(20 MG) BY MOUTH DAILY 90 tablet 3   No current facility-administered medications on file prior to visit.    Past Surgical History:  Procedure Laterality Date   COLONOSCOPY  01/31/2012   Sigmoid Diverticulosis, otherwise normal. Repeat 2023, Dr. 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  Mild tenderness to palpation of the inguinal region.  There is decreased range of motion with passive logroll of the right compared to the left.  FADIR is positive with limited internal rotation as well as some pain with internal rotation.  FABER negative.  Bilateral shoulder inspection reveals no concerns.  Minimal tenderness to palpation over the humeral head or biceps tendon.  Mild tenderness to palpation starting at the cervical neck down the trapezius into the shoulder.  Grip strength is appropriate and 5 out of 5 bilaterally.  Bilateral upper strength is appropriate.   Assessment & Plan:  1. 1. Right hip pain (Primary) - Patient's right hip pain is likely secondary to osteoarthritis.  I suspect this is an acute on chronic issue.  Will go ahead and get x-rays at this time and start  patient on meloxicam.  Will do 2 weeks worth of meloxicam and then as needed afterwards.  If patient has minimal improvement from meloxicam, patient will let us  know and we will go ahead and schedule intra-articular hip injection. - DG HIP UNILAT WITH PELVIS 2-3 VIEWS RIGHT; Future  2. Bilateral shoulder pain, unspecified chronicity - Patient's shoulder pain could be related to cervical radiculopathy, we will go ahead and get cervical x-rays of the neck - DG Cervical Spine Complete; Future    Jude Norton MD, PGY-4  Sports Medicine Fellow [facility]

## 2023-11-15 ENCOUNTER — Ambulatory Visit
Admission: RE | Admit: 2023-11-15 | Discharge: 2023-11-15 | Disposition: A | Discharge status: Home / Self Care | Source: Ambulatory Visit | Attending: Family Medicine

## 2023-11-15 DIAGNOSIS — M25511 Pain in right shoulder: Secondary | ICD-10-CM

## 2023-11-15 DIAGNOSIS — M25551 Pain in right hip: Secondary | ICD-10-CM

## 2023-12-11 ENCOUNTER — Ambulatory Visit (INDEPENDENT_AMBULATORY_CARE_PROVIDER_SITE_OTHER): Admitting: Family

## 2023-12-11 ENCOUNTER — Encounter (HOSPITAL_BASED_OUTPATIENT_CLINIC_OR_DEPARTMENT_OTHER): Payer: Self-pay | Admitting: Family

## 2023-12-11 VITALS — BP 130/80 | HR 64 | Ht 68.0 in | Wt 232.6 lb

## 2023-12-11 DIAGNOSIS — E785 Hyperlipidemia, unspecified: Secondary | ICD-10-CM | POA: Diagnosis not present

## 2023-12-11 DIAGNOSIS — I25118 Atherosclerotic heart disease of native coronary artery with other forms of angina pectoris: Secondary | ICD-10-CM

## 2023-12-11 DIAGNOSIS — I10 Essential (primary) hypertension: Secondary | ICD-10-CM | POA: Diagnosis not present

## 2023-12-11 NOTE — Patient Instructions (Addendum)
 Medication Instructions:  Continue your current medications  *If you need a refill on your cardiac medications before your next appointment, please call your pharmacy*  Lab Work: Your recent cholesterol numbers were looking much better!  Testing/Procedures: Your stress test was low risk and showed no blockage which is a good result!  Follow-Up: Your next appointment:   6 month(s)  Provider:   Annabella Scarce, MD, Rosaline Bane, NP, or Reche Finder, NP    Other Instructions  Keep up the great work with your dietary changes!  The next time you need new tennis shoes could try [employer].

## 2023-12-11 NOTE — Progress Notes (Unsigned)
 Cardiology Office Note   Date:  12/11/2023  ID:  Troy Stevens, DOB 10/12/1960, MRN [id number] PCP: Bulah Alm GORMAN DEVONNA  [facility] Providers Cardiologist:  Annabella Scarce, MD { Click to update primary MD,subspecialty MD or APP then REFRESH:1}    History of Present Illness Troy Stevens is a 63 y.o. male with history of hypertension, hyperlipidemia, prior tobacco use, obesity, Neri artery disease.  Cardiac CTA 12/23/2022 calcium  score 939 placing him in the 90th percentile, aortic atherosclerosis, aortic valve calcifications.  He was last seen 08/24/2023 by Dr. Scarce.  Lisinopril  discontinued due to cough, and adequate blood pressure control and olmesartan  40 mg daily initiated.  He is recommended to continue statin and make lifestyle adjustments to further lower cholesterol.  Smoking cessation also encouraged.  Myoview  09/29/2023 low risk study with no evidence of ischemia.  Presents today for follow-up independently.  Since last seen notes he has been making dietary changes.  His exercises somewhat limited by prior orthopedic issues.  He is active during the day standing on his feet.  We reviewed normal Myoview  and he was reassured by the result.  Reports BP at home has been routinely less than 130/80.  Reports no lightheadedness, dizziness, orthopnea, PND, edema.   ROS: Please see the history of present illness.    All other systems reviewed and are negative.   Studies Reviewed      Cardiac Studies & Procedures   ______________________________________________________________________________________________   STRESS TESTS  MYOCARDIAL PERFUSION IMAGING 09/29/2023  Narrative   The study is normal. The study is low risk.   No ST deviation was noted. T-wave inversion was noted. T wave inversion returned to baseline after 5-9 mins of recovery. The ECG was not diagnostic due to pharmacologic protocol. The study was converted from exercise protocol to pharmacologic due to  elevated BP before test 234/100 mmHg.   LV perfusion is normal. There is no evidence of ischemia. There is no evidence of infarction.   Left ventricular function is normal. Nuclear stress EF: 60%. The left ventricular ejection fraction is normal (55-65%). End diastolic cavity size is normal. End systolic cavity size is normal. No evidence of transient ischemic dilation (TID) noted.   Prior study not available for comparison.        CT SCANS  CT CARDIAC SCORING (SELF PAY ONLY) 12/23/2022  Addendum 12/29/2022 11:11 PM ADDENDUM REPORT: 12/29/2022 23:09  EXAM: OVER-READ INTERPRETATION  CT CHEST  The following report is an over-read performed by radiologist Dr. Leita Sandy [facility] on 12/29/2022. This over-read does not include interpretation of cardiac or coronary anatomy or pathology. The cardiac interpretation by the cardiologist is attached.  COMPARISON:  None.  FINDINGS: The heart is normal in size and there is no pericardial effusion. Three-vessel coronary artery calcifications are noted. There is atherosclerotic calcification of the aorta without evidence of aneurysm. The pulmonary trunk is normal in caliber.  No mediastinal lymphadenopathy. The trachea and visualized esophagus are within normal limits.  Mild atelectasis is present bilaterally. No effusion or pneumothorax.  No acute abnormality in the upper abdomen.  Degenerative changes are present in the thoracic spine. No acute or suspicious osseous abnormality.  IMPRESSION: 1. Coronary artery calcifications. 2. Aortic atherosclerosis.   Electronically Signed By: Leita Birmingham M.D. On: 12/29/2022 23:09  Narrative CLINICAL DATA:  32M for cardiovascular disease risk stratification  EXAM: Coronary Calcium  Score  TECHNIQUE: A gated, non-contrast computed tomography scan of the heart was performed using 3mm slice thickness. Axial images  were analyzed on a dedicated workstation. Calcium   scoring of the coronary arteries was performed using the Agatston method.  FINDINGS: Coronary Calcium  Score:  Left main: 166  Left anterior descending artery: 226  Left circumflex artery: 106  Right coronary artery: 442  Total: 939  Percentile: 98th  Aortic atherosclerosis.  Aortic valve calcification.  Pericardium: Normal.  Non-cardiac: See separate report from [facility].  IMPRESSION: 1. Coronary calcium  score of 939. This was 98th percentile for age-, race-, and sex-matched controls.  2. Aortic atherosclerosis.  3. Aortic valve calcification.  RECOMMENDATIONS: Coronary artery calcium  (CAC) score is a strong predictor of incident coronary heart disease (CHD) and provides predictive information beyond traditional risk factors. CAC scoring is reasonable to use in the decision to withhold, postpone, or initiate statin therapy in intermediate-risk or selected borderline-risk asymptomatic adults (age 5-75 years and LDL-C >=70 to <190 mg/dL) who do not have diabetes or established atherosclerotic cardiovascular disease (ASCVD).* In intermediate-risk (10-year ASCVD risk >=7.5% to <20%) adults or selected borderline-risk (10-year ASCVD risk >=5% to <7.5%) adults in whom a CAC score is measured for the purpose of making a treatment decision the following recommendations have been made:  If CAC=0, it is reasonable to withhold statin therapy and reassess in 5 to 10 years, as long as higher risk conditions are absent (diabetes mellitus, family history of premature CHD in first degree relatives (males <55 years; females <65 years), cigarette smoking, or LDL >=190 mg/dL).  If CAC is 1 to 99, it is reasonable to initiate statin therapy for patients >=63 years of age.  If CAC is >=100 or >=75th percentile, it is reasonable to initiate statin therapy at any age.  Cardiology referral should be considered for patients with CAC scores >=400 or >=75th  percentile.  *2018 AHA/ACC/AACVPR/AAPA/ABC/ACPM/ADA/AGS/APhA/ASPC/NLA/PCNA Guideline on the Management of Blood Cholesterol: A Report of the American College of Cardiology/American Heart Association Task Force on Clinical Practice Guidelines. J Am Coll Cardiol. 2019;73(24):3168-3209.  Annabella Scarce, MD  Electronically Signed: By: Annabella Scarce M.D. On: 12/23/2022 10:36     ______________________________________________________________________________________________      Risk Assessment/Calculations           Physical Exam VS:  BP 130/80   Pulse 64   Ht 5' 8 (1.727 m)   Wt 232 lb 9.6 oz (105.5 kg)   SpO2 96%   BMI 35.37 kg/m        Wt Readings from Last 3 Encounters:  12/11/23 232 lb 9.6 oz (105.5 kg)  11/14/23 222 lb (100.7 kg)  11/08/23 227 lb 3.2 oz (103.1 kg)    GEN: Well nourished, well developed in no acute distress NECK: No JVD; No carotid bruits CARDIAC: RRR, no murmurs, rubs, gallops RESPIRATORY:  Clear to auscultation without rales, wheezing or rhonchi  ABDOMEN: Soft, non-tender, non-distended EXTREMITIES:  No edema; No deformity   ASSESSMENT AND PLAN Assessment & Plan Hypertension Well-controlled with olmesartan . No adverse symptoms. Olmesartan  resolved previous cough from lisinopril . - Continue olmesartan  as prescribed. - Monitor blood pressure at home regularly. - Contact clinic if blood pressure readings become consistently high.  Cholesterol management Significant improvement with dietary changes. LDL at 70 mg/dL. - Continue current dietary regimen focusing on oats, fruits, and vegetables. - Re-evaluate cholesterol levels in six months.***  Foot pain and swelling Intermittent pain and swelling, exacerbated by certain footwear. Supportive shoes provide some relief. - Consider visiting [facility] for a professional shoe fitting to ensure proper support and comfort.  Dispo: follow up in 6 months  Signed, Reche GORMAN Finder,  NP

## 2024-01-11 ENCOUNTER — Ambulatory Visit (INDEPENDENT_AMBULATORY_CARE_PROVIDER_SITE_OTHER): Admitting: Family Medicine

## 2024-01-11 ENCOUNTER — Encounter: Payer: Self-pay | Admitting: Family Medicine

## 2024-01-11 VITALS — BP 128/80 | HR 81 | Wt 230.2 lb

## 2024-01-11 DIAGNOSIS — M25551 Pain in right hip: Secondary | ICD-10-CM | POA: Diagnosis not present

## 2024-01-11 DIAGNOSIS — M5412 Radiculopathy, cervical region: Secondary | ICD-10-CM | POA: Diagnosis not present

## 2024-01-11 NOTE — Progress Notes (Signed)
   Name: Troy Stevens   Date of Visit: 01/15/24   Date of last visit with me: 11/14/2023   CHIEF COMPLAINT:  Chief Complaint  Patient presents with   Procedure    Right hip injection.        HPI:  Discussed the use of AI scribe software for clinical note transcription with the patient, who gave verbal consent to proceed.  History of Present Illness  Patient is here for follow-up of his right hip pain.  Patient was seen few weeks ago in the sports medicine clinic and was started on meloxicam .  Patient notes a mild improvement but would like to go ahead and trial a steroid injection today.  Patient notes that the pain has been worsened over the last few weeks.  Patient's range of motion is still limited.   Patient's bilateral shoulder pain is likely related to the severe multilevel degenerative changes of the cervical spine as well which he notes is still bothersome.    10/30/2023    9:11 AM  Depression screen PHQ 2/9  Decreased Interest 0  Down, Depressed, Hopeless 0  PHQ - 2 Score 0     BP Readings from Last 3 Encounters:  01/11/24 128/80  12/11/23 130/80  11/14/23 138/84    BP 128/80   Pulse 81   Wt 230 lb 3.2 oz (104.4 kg)   SpO2 95%   BMI 35.00 kg/m    Physical Exam   Physical Exam  Reveals no gross abnormalities of the right hip.  There is still tenderness palpation in the inguinal region.  Decreased range of motion with passive logroll of the right compared to left.  FADIR still positive with limited internal rotation as well as some pain with internal rotation.  FABER does appear to have some decreased range of motion but no pain.  Strength is 5 out of 5 with abduction against resistance of the right hip as well as flexion however there is some noted pain with flexion against resistance. ASSESSMENT/PLAN:   Assessment & Plan Right hip pain  Cervical radiculitis    Assessment and Plan Assessment & Plan Right Hip Pain -Discussed with patient that his pain  is likely related to some arthritic change. This appears to be an acute on chronic issue given his job. Patient would like to go ahead with steroid injection of the hip. -Patient tolerated procedure well. Advised patient to  evaluate pain relief over the next few weeks.  Cervical radiculitis - Discussed with patient the bilateral arm pain likely coming from neck given signficiant degenerative changes. Will plan for MRI given minimal relief with meloxicam . Paitnet would likely require ESI  - Discussed use of oral steroids for patient who will consider it.  Azaria Stegman A. Vita MD [facility]

## 2024-01-15 MED ORDER — METHYLPREDNISOLONE ACETATE 40 MG/ML IJ SUSP
40.0000 mg | Freq: Once | INTRAMUSCULAR | Status: AC
Start: 2024-01-15 — End: ?

## 2024-01-15 MED ORDER — LIDOCAINE HCL 1 % IJ SOLN: 5.0000 mL | Freq: Once | INTRAMUSCULAR | Status: AC

## 2024-01-15 MED ORDER — METHYLPREDNISOLONE ACETATE 40 MG/ML IJ SUSP: 40.0000 mg | Freq: Once | INTRAMUSCULAR | Status: AC

## 2024-01-26 ENCOUNTER — Other Ambulatory Visit: Payer: Self-pay | Admitting: Medical

## 2024-01-28 ENCOUNTER — Ambulatory Visit
Admission: RE | Admit: 2024-01-28 | Discharge: 2024-01-28 | Disposition: A | Discharge status: Home / Self Care | Source: Ambulatory Visit | Attending: Family Medicine | Admitting: Family Medicine

## 2024-01-28 DIAGNOSIS — M5412 Radiculopathy, cervical region: Secondary | ICD-10-CM

## 2024-02-26 ENCOUNTER — Ambulatory Visit (INDEPENDENT_AMBULATORY_CARE_PROVIDER_SITE_OTHER): Admitting: Podiatry

## 2024-02-26 ENCOUNTER — Encounter: Payer: Self-pay | Admitting: Podiatry

## 2024-02-26 ENCOUNTER — Ambulatory Visit (INDEPENDENT_AMBULATORY_CARE_PROVIDER_SITE_OTHER)

## 2024-02-26 VITALS — Ht 68.0 in | Wt 230.0 lb

## 2024-02-26 DIAGNOSIS — M7752 Other enthesopathy of left foot: Secondary | ICD-10-CM

## 2024-02-26 DIAGNOSIS — M2042 Other hammer toe(s) (acquired), left foot: Secondary | ICD-10-CM

## 2024-02-26 NOTE — Progress Notes (Signed)
 Subjective:   Patient ID: Troy Stevens, male   DOB: 63 y.o.   MRN: [id number]   HPI Patient presents stating he has a lot of pain in his left foot and states it has been this way in the last year he has not been getting better he has tried shoe gear modifications anti-inflammatories padding and the toe has moved further second   ROS      Objective:  Physical Exam  Neurovascular status intact with the patient noted to have good digital perfusion has exquisite tenderness second metatarsal phalangeal joint with significant rigid elevation of the 2nd and 3rd toes left with dorsal keratotic tissue formation that is painful with shoe gear     Assessment:  Chronic digital deformity digits 2-3 left probable flexor plate stretch with chronic inflammation of the second MPJ left     Plan:  H&P reviewed at this point I have recommended digital fusion digits 2-3 left with pin fixation and shortening osteotomy second left with screw fixation.  Patient wants surgery and I explained this to him and at this point I allowed him to read consent form going over conservative treatment alternative treatments and complications.  After extensive review he signs and he is scheduled for outpatient surgery.  I did dispense air fracture walker properly fitted to his lower leg today and I want him to wear that and get used to it prior to the surgery.  X-rays taken today indicated that there is elevation with rigid contracture digit to left with elongated second metatarsal left

## 2024-02-28 ENCOUNTER — Telehealth: Payer: Self-pay | Admitting: Podiatry

## 2024-02-28 NOTE — Telephone Encounter (Signed)
 Pt was scheduled for surgery per his request 11/11 but after speaking to his wife moved it to 12/9.   I notified surgery center

## 2024-03-11 ENCOUNTER — Ambulatory Visit (INDEPENDENT_AMBULATORY_CARE_PROVIDER_SITE_OTHER): Admitting: Medical

## 2024-03-11 VITALS — BP 130/80 | HR 65 | Wt 235.6 lb

## 2024-03-11 DIAGNOSIS — Z23 Encounter for immunization: Secondary | ICD-10-CM | POA: Diagnosis not present

## 2024-03-11 DIAGNOSIS — M1611 Unilateral primary osteoarthritis, right hip: Secondary | ICD-10-CM

## 2024-03-11 DIAGNOSIS — M25551 Pain in right hip: Secondary | ICD-10-CM

## 2024-03-11 DIAGNOSIS — I10 Essential (primary) hypertension: Secondary | ICD-10-CM

## 2024-03-11 DIAGNOSIS — M1A09X0 Idiopathic chronic gout, multiple sites, without tophus (tophi): Secondary | ICD-10-CM

## 2024-03-11 DIAGNOSIS — F17200 Nicotine dependence, unspecified, uncomplicated: Secondary | ICD-10-CM

## 2024-03-11 NOTE — Progress Notes (Signed)
 Subjective: Chief Complaint  Patient presents with   Consult    Discuss possible hip replacement and needs referral   History of Present Illness Troy Stevens is a 63 year old male who presents with worsening foot pain and consideration for right hip replacement.  He has been experiencing worsening pain in his foot, which was previously evaluated and a surgical intervention to straighten two toes was suggested. He plans to have foot surgery with Dr. Magdalen in December.    Also, he has ongoing right hip pain.  Saw Dr. Vita here in July for same.  Despite receiving a steroid injection in July, his pain returned approximately a week after the steroid injection.  He continues to experience significant pain and decreased right hip ROM daily.  The right hip pain is constant, with occasional swelling at night, and worsens with certain movements. He has not engaged in physical therapy recently for his hip. The pain is localized in the hip area and does not radiate down his legs. He is currently working full-time despite the pain.  He is a smoker, currently using Black [employer] Mild cigars, and is attempting to quit smoking.  No numbness or pain radiating down his legs. No breathing problems.  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     allopurinol  (ZYLOPRIM ) 300 MG tablet TAKE 1 TABLET(300 MG) BY MOUTH DAILY 90 tablet 3   aspirin  EC 81 MG tablet Take 1 tablet (81 mg total) by mouth daily. Swallow whole.     cholecalciferol (VITAMIN D3) 25 MCG (1000 UNIT) tablet Take 1 tablet (1,000 Units  total) by mouth daily. 90 tablet 3   gabapentin  (NEURONTIN ) 100 MG capsule Take 2 capsules (200 mg total) by mouth at bedtime. 60 capsule 11   olmesartan  (BENICAR ) 40 MG tablet Take 1 tablet (40 mg total) by mouth daily. 90 tablet 3   rosuvastatin  (CRESTOR ) 20 MG tablet TAKE 1 TABLET(20 MG) BY MOUTH DAILY 90 tablet 2   Current Facility-Administered Medications on File Prior to Visit  Medication Dose Route Frequency Provider Last Rate Last Admin   lidocaine  (XYLOCAINE ) 1 % (with pres) injection 5 mL  5 mL Intradermal Once        methylPREDNISolone  acetate (DEPO-MEDROL ) injection 40 mg  40 mg Intramuscular Once        methylPREDNISolone  acetate (DEPO-MEDROL ) injection 40 mg  40 mg Intramuscular Once        ROS as in subjective   Objective: BP 130/80   Pulse 65   Wt 235 lb 9.6 oz (106.9 kg)   BMI 35.82 kg/m   Gen: wd, wn ,nad Oral: no small airway, unremarkable Heart rrr, normal s1, s2, no murmurs Lungs clear Ext: no edema MSK: right hip mildly reduced ext rotation, less full internal ROM and pain noted with ROM in general, otherwise legs nontender, left hip ROM wnl Legs neurovascularly intact   RADIOLOGY Right hip X-ray: Mild to moderate arthritis (11/2023)     Assessment and Plan Encounter Diagnoses  Name Primary?   Arthritis of right hip Yes   Needs flu shot    Pain of right hip    Chronic gout of multiple sites, unspecified cause    Essential hypertension    Smoker     Right hip osteoarthritis Mild to moderate osteoarthritis with persistent pain despite steroid injection. X-ray shows mild to moderate arthritis 11/2023. He is considering hip replacement - Refer to physical therapy for symptom management. - Refer to orthopedic surgeon for consultation on hip replacement necessity and timing. - Advise smoking cessation to reduce surgical risks.  Foot deformity requiring surgical correction Surgery scheduled for December 9th. Post-operative recovery expected to be  five weeks. - Proceed with foot surgery on December 9th. - Coordinate with orthopedic surgeon on timing of potential hip surgery if determined to need this.  Tobacco use Current use with attempts to quit. Smoking increases surgical risks. - Advise smoking cessation to reduce risk of blood clots and improve surgical outcomes.  History of gout, on allopurinol , last uric acid lat year ok  HTN - controlled, continue current medicaiton  Counseled on the influenza virus vaccine.  Vaccine information sheet given.  Influenza vaccine given after consent obtained.    Jacorion was seen today for consult.  Diagnoses and all orders for this visit:  Arthritis of right hip -     Ambulatory referral to Physical Therapy -     Ambulatory referral to Orthopedic Surgery  Needs flu shot -     Flu vaccine trivalent PF, 6mos and older(Flulaval,Afluria,Fluarix,Fluzone)  Pain of right hip -     Ambulatory referral to Physical Therapy -     Ambulatory referral to Orthopedic Surgery  Chronic gout of multiple sites, unspecified cause  Essential hypertension  Smoker    F/u pending therapy, ortho referral

## 2024-04-15 ENCOUNTER — Ambulatory Visit (INDEPENDENT_AMBULATORY_CARE_PROVIDER_SITE_OTHER): Admitting: Orthopaedic Surgery

## 2024-04-15 ENCOUNTER — Encounter: Payer: Self-pay | Admitting: Orthopaedic Surgery

## 2024-04-15 ENCOUNTER — Encounter: Payer: Self-pay | Admitting: Radiology

## 2024-04-15 VITALS — Wt 228.0 lb

## 2024-04-15 DIAGNOSIS — M25551 Pain in right hip: Secondary | ICD-10-CM | POA: Diagnosis not present

## 2024-04-15 DIAGNOSIS — M1611 Unilateral primary osteoarthritis, right hip: Secondary | ICD-10-CM | POA: Diagnosis not present

## 2024-04-15 NOTE — Progress Notes (Signed)
 The patient is a very pleasant 63 year old gentleman sent to me for evaluation treatment of right hip arthritis has been well-documented.  He has had an intra-articular steroid injection under ultrasound by Dr. Vita and that was somewhat helpful but only a short amount of time.  He does report pain all around his right hip and groin area.  He says it is stiff and is painful on daily basis.  It is at this point detrimentally affecting his mobility, his quality of life and his actives daily living.  He is not on any blood thinning medications.  I was able to review his past medical history and medications within epic.  On exam his right hip does have stiffness with internal and external rotation as well as pain in the groin with rotation.  His left hip moves smoothly.  He does have a problem with ADLs in terms of putting his shoes and socks on the right side and crossing his right leg over the left.  X-rays of his pelvis and right hip on the canopy system shows superior lateral joint space narrowing which is significant as well as para-articular osteophytes around the right hip.  The left hip space is well-maintained.  We had a long and thorough discussion about hip replacement surgery.  I went over hip replacement model and his x-rays and discussed the risks and benefits of surgery.  We gave him a handout about hip replacement surgery and discussed what his intraoperative and postoperative course would be.  He did let us  know that he is having surgery on his left foot in early December.  We should hold off on any type of hip replacement surgery until his foot surgeon allows him full weightbearing on that left side as he recovers so he is putting less pressure on the right hip replacement.  He understands this as well.  Will see him back in 3 months to see how he is doing overall.  Once he is released for full weightbearing on his left foot without any type or restrictions for the left foot we can proceed with a  right hip replacement.

## 2024-04-19 ENCOUNTER — Telehealth: Payer: Self-pay | Admitting: Podiatry

## 2024-04-19 NOTE — Telephone Encounter (Signed)
 DOS- 05/21/2024 (WILL BE DONE AT [facility])  2ND METATARSAL OSTEOTOMY LT- 28308 2ND/3RD HAMMERTOE REPAIR LT- 28285  [facility] EFFECTIVE DATE- 06/14/2023  DEDUCTIBLE- $3500 REMAINING- $0 OOP- $6000 REMAINING- $1744.77 FAMILY DEDUCTIBLE- $7000 REMAINING- $0 FAMILY OOP- $12000 REMAINING- $3916.20 COINSURANCE- 10%  PER [employer] PORTAL, PRIOR AUTH FOR CPT CODES 71691 AND [phone] (2 UNITS) HAVE BEEN APPROVED FROM 05/21/2024-08/19/2024. AUTH# [id number]

## 2024-04-29 ENCOUNTER — Encounter

## 2024-05-13 ENCOUNTER — Encounter

## 2024-05-17 ENCOUNTER — Ambulatory Visit (HOSPITAL_BASED_OUTPATIENT_CLINIC_OR_DEPARTMENT_OTHER): Admitting: Family

## 2024-05-20 MED ORDER — HYDROCODONE-ACETAMINOPHEN 10-325 MG PO TABS: 1.0000 | ORAL_TABLET | Freq: Three times a day (TID) | ORAL | 0 refills | Status: AC | PRN

## 2024-05-20 NOTE — Addendum Note (Signed)
 Addended by: MAGDALEN PASCO RAMAN on: 05/20/2024 04:58 PM   Modules accepted: Orders

## 2024-05-21 DIAGNOSIS — M7752 Other enthesopathy of left foot: Secondary | ICD-10-CM | POA: Diagnosis not present

## 2024-05-21 DIAGNOSIS — M2042 Other hammer toe(s) (acquired), left foot: Secondary | ICD-10-CM | POA: Diagnosis not present

## 2024-05-21 HISTORY — PX: FOOT SURGERY: SHX648

## 2024-05-22 ENCOUNTER — Telehealth: Payer: Self-pay | Admitting: Podiatry

## 2024-05-22 ENCOUNTER — Encounter: Payer: Self-pay | Admitting: Podiatry

## 2024-05-22 NOTE — Telephone Encounter (Signed)
 wife lft mess about RTW work note for clear channel communications. I called bk and will email her with approx RTW 07/08/24.

## 2024-05-27 ENCOUNTER — Ambulatory Visit (INDEPENDENT_AMBULATORY_CARE_PROVIDER_SITE_OTHER)

## 2024-05-27 ENCOUNTER — Ambulatory Visit

## 2024-05-27 VITALS — BP 163/88 | HR 55 | Temp 97.0°F

## 2024-05-27 DIAGNOSIS — M2042 Other hammer toe(s) (acquired), left foot: Secondary | ICD-10-CM

## 2024-05-27 DIAGNOSIS — M7752 Other enthesopathy of left foot: Secondary | ICD-10-CM

## 2024-05-27 NOTE — Progress Notes (Signed)
 Patient presents for post-op visit today, POV # 1 DOS 12/9 LT 2ND TO SHORTENING OSTEOTOMY W /SCREW, LT 2ND/3RD  HAMMERTOE  REPAIR( FUSION) W/ PIN  Hurting a little here and there, some stinging and burning, especially in the top of the foot.  Patient's wife, Leonor, is in the room with him today. .  RN Notes: n/a  Vital Signs: Today's Vitals   05/27/24 1047 05/27/24 1050  BP: (!) 170/85 (!) 163/88  Pulse: (!) 55 (!) 55  Temp: (!) 97 F (36.1 C)   TempSrc: Oral   PainSc: 5    PainLoc: Foot       Radiographs: [x]  Taken []  Not taken  Surgical Site Assessment:  - Dressing:  [x]  Minimal dry blood, intact []  Reinforced   []  Changed     -RN Notes: n/a  - Incision:  [x]  CDI (clean, dry, intact)  [x]  Mild erythema  []  Drainage noted   -RN Notes: n/a  - Swelling:  []  None  []  Mild  [x]  Moderate   []  Significant     -RN Notes: n/a  - Bruising:  []  None  [x]  Present: across top of foot, base of toes.    - Sutures/Staples:  []  None [x]  Intact  []  Removed Today  [x]  Plan to remove at next visit   -Cast/Splint/Pins: []  None [x]  Intact []  Removed Today []  Plan to remove at next visit []  Replaced  -Signs of infection:  [x]  None  []  Present - Describe: n/a  -DME:    []  None [x]  AFW []  Surgical shoe []  Cast  []  Splint  -Walking status:  [x]  Full WB  []  Partial WB  []  NWB  -Utilizing device:  [x]  None []  Knee Scooter []  Crutches []  Wheelchair    DVT assessment:  [x]  Denies symptoms []  Chest pain/SOB []  Pain in calf/redness/warmth   Redressed DSD and ace wrap. Educated on signs of infection, proper dressing care, pain management, and weight bearing status. Patient will contact provider with any new or worsening symptoms. The provider assessed the patient today and reviewed instructions regarding plan of care.

## 2024-05-27 NOTE — Progress Notes (Signed)
 Subjective:   Patient ID: Troy Stevens, male   DOB: 63 y.o.   MRN: [id number]   HPI Patient evaluated by nurse for postop visit 1 today   ROS      Objective:  Physical Exam  Everything looked good clinically and x-ray checked     Assessment:  Doing well post surgery left     Plan:  X-ray indicated good alignment fixation in place screws in place nurse applied sterile dressing and continue immobilization

## 2024-06-03 ENCOUNTER — Other Ambulatory Visit: Payer: Self-pay | Admitting: Medical

## 2024-06-03 MED ORDER — ROSUVASTATIN CALCIUM 20 MG PO TABS: ORAL_TABLET | ORAL | 1 refills | Status: AC

## 2024-06-10 ENCOUNTER — Encounter: Payer: Self-pay | Admitting: Podiatry

## 2024-06-10 ENCOUNTER — Ambulatory Visit (INDEPENDENT_AMBULATORY_CARE_PROVIDER_SITE_OTHER)

## 2024-06-10 ENCOUNTER — Ambulatory Visit (INDEPENDENT_AMBULATORY_CARE_PROVIDER_SITE_OTHER): Admitting: Podiatry

## 2024-06-10 DIAGNOSIS — M2042 Other hammer toe(s) (acquired), left foot: Secondary | ICD-10-CM

## 2024-06-10 NOTE — Progress Notes (Signed)
 Subjective:   Patient ID: Troy Stevens, male   DOB: 63 y.o.   MRN: [id number]   HPI Patient seen by nurse doing well the pin in the second toe as moved out by approximately 1 inch probable trauma   ROS      Objective:  Physical Exam  Nurse did evaluation incision sites look good pin is out on the second toe on x-ray evaluation everything else looks excellent     Assessment:  Doing well 3 weeks postop     Plan:  Pin removed from the second digit will keep stabilized with bandage technique and stitches removed wound edges coapted well advised on continued compression reappoint to recheck in 2 weeks for pin removal #3 and continue immobilization till that  X-rays indicate osteotomy is healing very well position of toe looks good pin and second digit has moved out

## 2024-06-10 NOTE — Progress Notes (Signed)
 Patient presents for post-op visit today, POV # 2 DOS 12/9 LT 2ND TO SHORTENING OSTEOTOMY W /SCREW, LT 2ND/3RD  HAMMERTOE  REPAIR( FUSION) W/ PIN  Doing alright. I think one of the pins has worked itself out..  Notes: n/a  Vital Signs: Today's Vitals   06/10/24 1025  PainSc: 0-No pain      Radiographs: [x]  Taken []  Not taken  Surgical Site Assessment:  - Dressing:  [x]  Minimal dry blood, intact []  Reinforced   []  Changed     -Notes: n/a  - Incision:  [x]  CDI (clean, dry, intact)  [x]  Mild erythema  []  Drainage noted   -Notes: n/a  - Swelling:  []  None  [x]  Mild  []  Moderate   []  Significant     -Notes: n/a  - Bruising:  [x]  None  []  Present: n/a   - Sutures/Staples:  []  None [x]  Intact  [x]  Removed Today  []  Plan to remove at next visit   -Cast/Splint/Pins: []  None [x]  LT 3rd Intact, LT 2nd pin is no longer in place, last week pt noticed it was sticking out more []  Removed Today []  Plan to remove at next visit []  Replaced  -Signs of infection:  [x]  None  []  Present - Describe: n/a  -DME:    []  None [x]  AFW []  Surgical shoe []  Cast  []  Splint  -Walking status:  [x]  Full WB  []  Partial WB  []  NWB  -Utilizing device:  [x]  None []  Knee Scooter []  Crutches []  Wheelchair    DVT assessment:  [x]  Denies symptoms []  Chest pain/SOB []  Pain in calf/redness/warmth   Redressed DSD and ace wrap. Educated on signs of infection, proper dressing care, pain management, and weight bearing status. Patient will contact provider with any new or worsening symptoms. The provider assessed the patient today and reviewed instructions regarding plan of care.

## 2024-06-24 ENCOUNTER — Ambulatory Visit: Admitting: Podiatry

## 2024-06-24 ENCOUNTER — Ambulatory Visit (INDEPENDENT_AMBULATORY_CARE_PROVIDER_SITE_OTHER)

## 2024-06-24 ENCOUNTER — Encounter: Payer: Self-pay | Admitting: Podiatry

## 2024-06-24 VITALS — Ht 68.0 in | Wt 228.0 lb

## 2024-06-24 DIAGNOSIS — M2042 Other hammer toe(s) (acquired), left foot: Secondary | ICD-10-CM

## 2024-06-25 NOTE — Progress Notes (Signed)
 Subjective:   Patient ID: Troy Stevens, male   DOB: 64 y.o.   MRN: [id number]   HPI Patient states doing very well very pleased so far here for pin removal   ROS      Objective:  Physical Exam  Neurovascular status intact negative [place]' sign noted wound edges coapted well digits 2 3 good alignment pin intact third toe     Assessment:  Doing well post foot surgery left     Plan:  Pin removed third toe x-rays dated today look good and at this point clinically everything looks excellent.  Encouraged range of motion reappoint to recheck 4 weeks continue immobilization with gradual increase in mobilization  X-rays indicate osteotomy is healing well digits in good alignment

## 2024-06-28 ENCOUNTER — Encounter: Payer: Self-pay | Admitting: Podiatry

## 2024-07-01 ENCOUNTER — Encounter: Payer: Self-pay | Admitting: Podiatry

## 2024-07-01 ENCOUNTER — Telehealth: Payer: Self-pay | Admitting: Podiatry

## 2024-07-01 NOTE — Telephone Encounter (Signed)
 Emailed work note to pt's wife RTW 07/08/24 with no restrictions.

## 2024-07-15 ENCOUNTER — Ambulatory Visit: Admitting: Orthopaedic Surgery

## 2024-08-07 ENCOUNTER — Encounter: Admitting: Podiatry

## 2024-08-09 ENCOUNTER — Ambulatory Visit (HOSPITAL_BASED_OUTPATIENT_CLINIC_OR_DEPARTMENT_OTHER): Admitting: Family

## 2024-08-12 ENCOUNTER — Ambulatory Visit: Admitting: Orthopaedic Surgery

## 2024-11-12 ENCOUNTER — Encounter: Payer: Self-pay | Admitting: Medical
